# Patient Record
Sex: Female | Born: 1955 | Race: White | Hispanic: No | Marital: Married | Smoking: Never smoker
Health system: Southern US, Community
[De-identification: ages and names within clinical notes are randomized; demographics above are authoritative.]

## PROBLEM LIST (undated history)

## (undated) DIAGNOSIS — C50919 Malignant neoplasm of unspecified site of unspecified female breast: Secondary | ICD-10-CM

## (undated) DIAGNOSIS — Z1379 Encounter for other screening for genetic and chromosomal anomalies: Principal | ICD-10-CM

## (undated) DIAGNOSIS — Z803 Family history of malignant neoplasm of breast: Secondary | ICD-10-CM

## (undated) DIAGNOSIS — Z923 Personal history of irradiation: Secondary | ICD-10-CM

## (undated) DIAGNOSIS — E559 Vitamin D deficiency, unspecified: Secondary | ICD-10-CM

## (undated) HISTORY — DX: Malignant neoplasm of unspecified site of unspecified female breast: C50.919

## (undated) HISTORY — PX: BREAST SURGERY: SHX581

## (undated) HISTORY — DX: Family history of malignant neoplasm of breast: Z80.3

## (undated) HISTORY — PX: BREAST LUMPECTOMY: SHX2

## (undated) HISTORY — DX: Encounter for other screening for genetic and chromosomal anomalies: Z13.79

---

## 2009-05-19 ENCOUNTER — Encounter: Admission: RE | Admit: 2009-05-19 | Discharge: 2009-05-19 | Payer: Self-pay | Admitting: Family Medicine

## 2009-05-22 ENCOUNTER — Encounter: Admission: RE | Admit: 2009-05-22 | Discharge: 2009-05-22 | Payer: Self-pay | Admitting: Family Medicine

## 2009-12-21 ENCOUNTER — Encounter: Admission: RE | Admit: 2009-12-21 | Discharge: 2009-12-21 | Payer: Self-pay | Admitting: Family Medicine

## 2010-05-21 ENCOUNTER — Encounter: Admission: RE | Admit: 2010-05-21 | Discharge: 2010-05-21 | Payer: Self-pay | Admitting: Family Medicine

## 2010-05-28 ENCOUNTER — Encounter: Admission: RE | Admit: 2010-05-28 | Discharge: 2010-05-28 | Payer: Self-pay | Admitting: Family Medicine

## 2010-11-07 ENCOUNTER — Encounter: Payer: Self-pay | Admitting: Family Medicine

## 2010-11-08 ENCOUNTER — Encounter: Payer: Self-pay | Admitting: Family Medicine

## 2011-06-22 ENCOUNTER — Other Ambulatory Visit: Payer: Self-pay | Admitting: Family Medicine

## 2011-06-22 DIAGNOSIS — Z1231 Encounter for screening mammogram for malignant neoplasm of breast: Secondary | ICD-10-CM

## 2011-06-22 DIAGNOSIS — Z780 Asymptomatic menopausal state: Secondary | ICD-10-CM

## 2011-07-07 ENCOUNTER — Ambulatory Visit
Admission: RE | Admit: 2011-07-07 | Discharge: 2011-07-07 | Disposition: A | Discharge status: Home / Self Care | Payer: BC Managed Care – PPO | Source: Ambulatory Visit | Attending: Family Medicine | Admitting: Family Medicine

## 2011-07-07 DIAGNOSIS — Z1231 Encounter for screening mammogram for malignant neoplasm of breast: Secondary | ICD-10-CM

## 2011-07-07 DIAGNOSIS — Z780 Asymptomatic menopausal state: Secondary | ICD-10-CM

## 2012-06-27 ENCOUNTER — Other Ambulatory Visit: Payer: Self-pay | Admitting: Family Medicine

## 2012-06-27 DIAGNOSIS — Z1231 Encounter for screening mammogram for malignant neoplasm of breast: Secondary | ICD-10-CM

## 2012-07-12 ENCOUNTER — Ambulatory Visit
Admission: RE | Admit: 2012-07-12 | Discharge: 2012-07-12 | Disposition: A | Discharge status: Home / Self Care | Payer: BC Managed Care – PPO | Source: Ambulatory Visit | Attending: Family Medicine | Admitting: Family Medicine

## 2012-07-12 DIAGNOSIS — Z1231 Encounter for screening mammogram for malignant neoplasm of breast: Secondary | ICD-10-CM

## 2013-07-22 ENCOUNTER — Other Ambulatory Visit: Payer: Self-pay | Admitting: Family Medicine

## 2013-07-22 DIAGNOSIS — Z1231 Encounter for screening mammogram for malignant neoplasm of breast: Secondary | ICD-10-CM

## 2013-07-24 ENCOUNTER — Ambulatory Visit: Payer: BC Managed Care – PPO

## 2013-08-14 ENCOUNTER — Ambulatory Visit
Admission: RE | Admit: 2013-08-14 | Discharge: 2013-08-14 | Disposition: A | Discharge status: Home / Self Care | Payer: BC Managed Care – PPO | Source: Ambulatory Visit | Attending: Family Medicine | Admitting: Family Medicine

## 2013-08-14 DIAGNOSIS — Z1231 Encounter for screening mammogram for malignant neoplasm of breast: Secondary | ICD-10-CM

## 2014-07-31 ENCOUNTER — Other Ambulatory Visit: Payer: Self-pay

## 2014-07-31 DIAGNOSIS — Z1231 Encounter for screening mammogram for malignant neoplasm of breast: Secondary | ICD-10-CM

## 2014-08-21 ENCOUNTER — Ambulatory Visit
Admission: RE | Admit: 2014-08-21 | Discharge: 2014-08-21 | Disposition: A | Discharge status: Home / Self Care | Payer: BC Managed Care – PPO | Source: Ambulatory Visit

## 2014-08-21 DIAGNOSIS — Z1231 Encounter for screening mammogram for malignant neoplasm of breast: Secondary | ICD-10-CM

## 2015-09-08 ENCOUNTER — Other Ambulatory Visit: Payer: Self-pay

## 2015-09-08 DIAGNOSIS — Z1231 Encounter for screening mammogram for malignant neoplasm of breast: Secondary | ICD-10-CM

## 2015-09-30 ENCOUNTER — Ambulatory Visit
Admission: RE | Admit: 2015-09-30 | Discharge: 2015-09-30 | Disposition: A | Discharge status: Home / Self Care | Payer: PRIVATE HEALTH INSURANCE | Source: Ambulatory Visit

## 2015-09-30 DIAGNOSIS — Z1231 Encounter for screening mammogram for malignant neoplasm of breast: Secondary | ICD-10-CM

## 2016-07-07 DIAGNOSIS — Z136 Encounter for screening for cardiovascular disorders: Secondary | ICD-10-CM | POA: Diagnosis not present

## 2016-07-07 DIAGNOSIS — Z0000 Encounter for general adult medical examination without abnormal findings: Secondary | ICD-10-CM | POA: Diagnosis not present

## 2016-07-12 DIAGNOSIS — Z1211 Encounter for screening for malignant neoplasm of colon: Secondary | ICD-10-CM | POA: Diagnosis not present

## 2016-07-12 DIAGNOSIS — Z0000 Encounter for general adult medical examination without abnormal findings: Secondary | ICD-10-CM | POA: Diagnosis not present

## 2016-07-12 DIAGNOSIS — Z23 Encounter for immunization: Secondary | ICD-10-CM | POA: Diagnosis not present

## 2016-09-06 ENCOUNTER — Other Ambulatory Visit: Payer: Self-pay | Admitting: Family Medicine

## 2016-09-06 DIAGNOSIS — Z1231 Encounter for screening mammogram for malignant neoplasm of breast: Secondary | ICD-10-CM

## 2016-10-07 ENCOUNTER — Ambulatory Visit
Admission: RE | Admit: 2016-10-07 | Discharge: 2016-10-07 | Disposition: A | Discharge status: Home / Self Care | Payer: PRIVATE HEALTH INSURANCE | Source: Ambulatory Visit | Attending: Family Medicine | Admitting: Family Medicine

## 2016-10-07 DIAGNOSIS — Z1231 Encounter for screening mammogram for malignant neoplasm of breast: Secondary | ICD-10-CM

## 2016-11-19 DIAGNOSIS — R05 Cough: Secondary | ICD-10-CM | POA: Diagnosis not present

## 2016-11-19 DIAGNOSIS — J00 Acute nasopharyngitis [common cold]: Secondary | ICD-10-CM | POA: Diagnosis not present

## 2017-09-19 ENCOUNTER — Other Ambulatory Visit: Payer: Self-pay | Admitting: Family Medicine

## 2017-09-19 DIAGNOSIS — Z1231 Encounter for screening mammogram for malignant neoplasm of breast: Secondary | ICD-10-CM

## 2017-10-17 HISTORY — PX: BREAST EXCISIONAL BIOPSY: SUR124

## 2017-10-17 HISTORY — PX: BREAST LUMPECTOMY: SHX2

## 2017-10-23 ENCOUNTER — Ambulatory Visit: Payer: Self-pay

## 2017-11-14 ENCOUNTER — Ambulatory Visit
Admission: RE | Admit: 2017-11-14 | Discharge: 2017-11-14 | Disposition: A | Discharge status: Home / Self Care | Payer: BLUE CROSS/BLUE SHIELD | Source: Ambulatory Visit | Attending: Family Medicine | Admitting: Family Medicine

## 2017-11-14 DIAGNOSIS — Z1231 Encounter for screening mammogram for malignant neoplasm of breast: Secondary | ICD-10-CM

## 2017-11-15 ENCOUNTER — Other Ambulatory Visit: Payer: Self-pay | Admitting: Family Medicine

## 2017-11-15 DIAGNOSIS — R928 Other abnormal and inconclusive findings on diagnostic imaging of breast: Secondary | ICD-10-CM

## 2017-11-17 ENCOUNTER — Ambulatory Visit
Admission: RE | Admit: 2017-11-17 | Discharge: 2017-11-17 | Disposition: A | Discharge status: Home / Self Care | Payer: BLUE CROSS/BLUE SHIELD | Source: Ambulatory Visit | Attending: Family Medicine | Admitting: Family Medicine

## 2017-11-17 ENCOUNTER — Other Ambulatory Visit: Payer: Self-pay | Admitting: Family Medicine

## 2017-11-17 DIAGNOSIS — R928 Other abnormal and inconclusive findings on diagnostic imaging of breast: Secondary | ICD-10-CM

## 2017-11-17 DIAGNOSIS — R922 Inconclusive mammogram: Secondary | ICD-10-CM | POA: Diagnosis not present

## 2017-11-17 DIAGNOSIS — N651 Disproportion of reconstructed breast: Secondary | ICD-10-CM | POA: Diagnosis not present

## 2017-11-21 ENCOUNTER — Ambulatory Visit
Admission: RE | Admit: 2017-11-21 | Discharge: 2017-11-21 | Disposition: A | Discharge status: Home / Self Care | Payer: BLUE CROSS/BLUE SHIELD | Source: Ambulatory Visit | Attending: Family Medicine | Admitting: Family Medicine

## 2017-11-21 ENCOUNTER — Other Ambulatory Visit: Payer: Self-pay | Admitting: Family Medicine

## 2017-11-21 DIAGNOSIS — C50211 Malignant neoplasm of upper-inner quadrant of right female breast: Secondary | ICD-10-CM | POA: Diagnosis not present

## 2017-11-21 DIAGNOSIS — N6489 Other specified disorders of breast: Secondary | ICD-10-CM

## 2017-11-21 DIAGNOSIS — R928 Other abnormal and inconclusive findings on diagnostic imaging of breast: Secondary | ICD-10-CM

## 2017-11-23 ENCOUNTER — Encounter: Payer: Self-pay | Admitting: *Deleted

## 2017-11-23 ENCOUNTER — Telehealth: Payer: Self-pay | Admitting: Oncology

## 2017-11-23 NOTE — Telephone Encounter (Signed)
Spoke with patient to confirm morning [facility] appointment for 11/29/17, will mail packet

## 2017-11-28 ENCOUNTER — Other Ambulatory Visit: Payer: Self-pay | Admitting: *Deleted

## 2017-11-28 DIAGNOSIS — Z170 Estrogen receptor positive status [ER+]: Principal | ICD-10-CM

## 2017-11-28 DIAGNOSIS — C50211 Malignant neoplasm of upper-inner quadrant of right female breast: Secondary | ICD-10-CM | POA: Insufficient documentation

## 2017-11-28 NOTE — Progress Notes (Signed)
[facility]  [phone]) [phone] [phone]) [phone]     ID: Lisa Valenzuela DOB: [birth year]  MR#: [id number]  OTR#:[id number]  Patient Care Team: Fanny Bien, MD as PCP - General (Family Medicine) Magrinat, Virgie Dad, MD as Consulting Physician (Oncology) Alphonsa Overall, MD as Consulting Physician (General Surgery) Kyung Rudd, MD as Consulting Physician (Radiation Oncology) OTHER MD:  CHIEF COMPLAINT:   CURRENT TREATMENT:    HISTORY OF CURRENT ILLNESS: Lisa Valenzuela had routine screening mammography on 11/14/2017 showing a possible abnormality in her bilateral breasts. She underwent a bilateral diagnostic mammography with tomography and bilateral breast ultrasonography at [facility] on 11/17/2017 showing: breast density category C.  The left breast mass was evaluated and proved to be a benign cyst.  On the right however there was an area of architectural distortion, which could not be well delineated.  It was negative on ultrasonography.  The right axilla also was benign on ultrasound.  Accordingly on 11/21/2017 the patient proceeded to biopsy of the right breast area in question. 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 E-cadherin negative. The base of tumor is HER-2 negative and the proliferation marker Ki67 is 2%.   The patient's subsequent history is as detailed below.  INTERVAL HISTORY: Lisa Valenzuela was evaluated in the multidisciplinary breast cancer clinic on 11/29/17 accompanied by her husband, Kasandra Knudsen. Her case was also presented at the multidisciplinary breast cancer conference on the same day. At that time a preliminary plan was proposed: Start with an MRI, then breast conserving surgery, considering Oncotype depending on tumor size, then radiation and antiestrogens   REVIEW OF  SYSTEMS: Lisa Valenzuela is doing well. She denies unusual headaches, visual changes, nausea, vomiting, or dizziness. There has been no unusual cough, phlegm production, or pleurisy. This been no change in bowel or bladder habits. She denies unexplained fatigue or unexplained weight loss, bleeding, rash, or fever. A detailed review of systems was otherwise noncontributory.    PAST MEDICAL HISTORY: Past Medical History:  Diagnosis Date  . Vitamin D deficiency     PAST SURGICAL HISTORY: History reviewed. No pertinent surgical history.  FAMILY HISTORY Family History  Problem Relation Age of Onset  . Breast cancer Mother   . Prostate cancer Father   . Breast cancer Sister   The patient's father, 46, and mother, 4 are still alive. Her father was diagnosed with prostate cancer at 25 years old. Her mother was diagnosed with breast cancer at 62 years old. The patient has two brothers and two sisters. One of her sisters was diagnoses with breast cancer at 63 years old.   GYNECOLOGIC HISTORY:  No LMP recorded. Patient is postmenopausal. Menarche: 62 years old Age at first live birth: 62 years old Silt P 2 LMP 2009 Contraceptive yes, 1980-2000 HRT no  Hysterectomy? 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 Her husband, Kasandra Knudsen is a Theme park manager at [employer].  They have one daughter, [name], 10, who lives in [place], [place], and is a middle school Associate Professor. They also have one son, [name], 30, who lives in [place], [place], and is a Physicist, medical. The patient has two grandchildren.     ADVANCED DIRECTIVES:    HEALTH MAINTENANCE: Social History   Tobacco Use  . Smoking status: Never Smoker  Substance Use Topics  . Alcohol use: No  Frequency: Never  . Drug use: No     Colonoscopy:  PAP:  Bone density:   No Known Allergies  Current Outpatient Medications  Medication Sig Dispense Refill  . Black Cohosh 200 MG CAPS Take by mouth.    .  Cholecalciferol (VITAMIN D3) 1000 units CAPS TAKE 1 CAPSULE BY MOUTH EVERY DAY    . ibuprofen (ADVIL,MOTRIN) 200 MG tablet Take by mouth.    . minocycline (DYNACIN) 75 MG tablet Take by mouth.     No current facility-administered medications for this visit.     OBJECTIVE: Middle-aged white woman who appears well  Vitals:   11/29/17 0850  BP: (!) 157/83  Pulse: 87  Resp: 18  Temp: (!) 97.5 F (36.4 C)     There is no height or weight on file to calculate BMI.   Wt Readings from Last 3 Encounters:  11/29/17 143 lb 14.4 oz (65.3 kg)      ECOG FS:0 - Asymptomatic  Ocular: Sclerae unicteric, pupils round and equal Ear-nose-throat: Oropharynx clear and moist Lymphatic: No cervical or supraclavicular adenopathy Lungs no rales or rhonchi Heart regular rate and rhythm Abd soft, nontender, positive bowel sounds MSK no focal spinal tenderness, no joint edema Neuro: non-focal, well-oriented, appropriate affect Breasts: The right breast is status post recent biopsy.  There is a mild ecchymosis.  I do not palpate a mass.  The left breast is unremarkable.  Both axillae are benign.   LAB RESULTS:  CMP     Component Value Date/Time   NA 144 11/29/2017 0834   K 3.9 11/29/2017 0834   CL 105 11/29/2017 0834   CO2 28 11/29/2017 0834   GLUCOSE 119 11/29/2017 0834   BUN 18 11/29/2017 0834   CREATININE 0.99 11/29/2017 0834   CALCIUM 9.9 11/29/2017 0834   PROT 7.9 11/29/2017 0834   ALBUMIN 4.2 11/29/2017 0834   AST 22 11/29/2017 0834   ALT 20 11/29/2017 0834   ALKPHOS 90 11/29/2017 0834   BILITOT 0.5 11/29/2017 0834   GFRNONAA 60 (L) 11/29/2017 0834   GFRAA >60 11/29/2017 0834    No results found for: TOTALPROTELP, ALBUMINELP, A1GS, A2GS, BETS, BETA2SER, GAMS, MSPIKE, SPEI  No results found for: KPAFRELGTCHN, LAMBDASER, KAPLAMBRATIO  Lab Results  Component Value Date   WBC 6.8 11/29/2017   NEUTROABS 4.3 11/29/2017   HCT 43.0 11/29/2017   MCV 92.0 11/29/2017   PLT 240  11/29/2017    '@LASTCHEMISTRY' @  No results found for: LABCA2  No components found for: [id number]  No results for input(s): INR in the last 168 hours.  No results found for: LABCA2  No results found for: WVP710  No results found for: GYI948  No results found for: NIO270  No results found for: CA2729  No components found for: HGQUANT  No results found for: CEA1 / No results found for: CEA1   No results found for: AFPTUMOR  No results found for: CHROMOGRNA  No results found for: PSA1  Appointment on 11/29/2017  Component Date Value Ref Range Status  . Sodium 11/29/2017 144  136 - 145 mmol/L Final  . Potassium 11/29/2017 3.9  3.5 - 5.1 mmol/L Final  . Chloride 11/29/2017 105  98 - 109 mmol/L Final  . CO2 11/29/2017 28  22 - 29 mmol/L Final  . Glucose, Bld 11/29/2017 119  70 - 140 mg/dL Final  . BUN 11/29/2017 18  7 - 26 mg/dL Final  . Creatinine 11/29/2017 0.99  0.60 - 1.10 mg/dL Final  .  Calcium 11/29/2017 9.9  8.4 - 10.4 mg/dL Final  . Total Protein 11/29/2017 7.9  6.4 - 8.3 g/dL Final  . Albumin 11/29/2017 4.2  3.5 - 5.0 g/dL Final  . AST 11/29/2017 22  5 - 34 U/L Final  . ALT 11/29/2017 20  0 - 55 U/L Final  . Alkaline Phosphatase 11/29/2017 90  40 - 150 U/L Final  . Total Bilirubin 11/29/2017 0.5  0.2 - 1.2 mg/dL Final  . GFR, Est Non Af Am 11/29/2017 60* >60 mL/min Final  . GFR, Est AFR Am 11/29/2017 >60  >60 mL/min Final   Comment: (NOTE) The eGFR has been calculated using the CKD EPI equation. This calculation has not been validated in all clinical situations. eGFR's persistently <60 mL/min signify possible Chronic Kidney Disease.   Georgiann Hahn gap 11/29/2017 11  3 - 11 Final   Performed at [facility], [place] [address] [place], [place] [address]  . WBC Count 11/29/2017 6.8  3.9 - 10.3 K/uL Final  . RBC 11/29/2017 4.67  3.70 - 5.45 MIL/uL Final  . Hemoglobin 11/29/2017 14.5  11.6 - 15.9 g/dL Final  . HCT 11/29/2017 43.0  34.8 - 46.6  % Final  . MCV 11/29/2017 92.0  79.5 - 101.0 fL Final  . MCH 11/29/2017 31.0  25.1 - 34.0 pg Final  . MCHC 11/29/2017 33.7  31.5 - 36.0 g/dL Final  . RDW 11/29/2017 13.1  11.2 - 14.5 % Final  . Platelet Count 11/29/2017 240  145 - 400 K/uL Final  . Neutrophils Relative % 11/29/2017 63  % Final  . Neutro Abs 11/29/2017 4.3  1.5 - 6.5 K/uL Final  . Lymphocytes Relative 11/29/2017 24  % Final  . Lymphs Abs 11/29/2017 1.6  0.9 - 3.3 K/uL Final  . Monocytes Relative 11/29/2017 7  % Final  . Monocytes Absolute 11/29/2017 0.5  0.1 - 0.9 K/uL Final  . Eosinophils Relative 11/29/2017 5  % Final  . Eosinophils Absolute 11/29/2017 0.3  0.0 - 0.5 K/uL Final  . Basophils Relative 11/29/2017 1  % Final  . Basophils Absolute 11/29/2017 0.1  0.0 - 0.1 K/uL Final   Performed at [facility], [place] [address], [place], [place] [address]    (this displays the last labs from the last 3 days)  No results found for: TOTALPROTELP, ALBUMINELP, A1GS, A2GS, BETS, BETA2SER, GAMS, MSPIKE, SPEI (this displays SPEP labs)  No results found for: KPAFRELGTCHN, LAMBDASER, KAPLAMBRATIO (kappa/lambda light chains)  No results found for: HGBA, HGBA2QUANT, HGBFQUANT, HGBSQUAN (Hemoglobinopathy evaluation)   No results found for: LDH  No results found for: IRON, TIBC, IRONPCTSAT (Iron and TIBC)  No results found for: FERRITIN  Urinalysis No results found for: COLORURINE, APPEARANCEUR, LABSPEC, PHURINE, GLUCOSEU, HGBUR, BILIRUBINUR, KETONESUR, PROTEINUR, UROBILINOGEN, NITRITE, LEUKOCYTESUR   STUDIES: US Breast Ltd Uni Left Inc Axilla  Result Date: 11/17/2017 CLINICAL DATA:  Recall from screening mammography with tomosynthesis, possible mass and/or architectural distortion involving the inner right breast at middle posterior depth visible only on the CC view and possible mass involving the lower retroareolar left breast at posterior depth visible only on the MLO view. EXAM: 2D DIGITAL  DIAGNOSTIC BILATERAL MAMMOGRAM WITH CAD AND ADJUNCT TOMO LIMITED ULTRASOUND BILATERAL BREASTS COMPARISON:  Mammography 11/14/2017, 10/07/2016 and earlier. Right breast ultrasound 12/21/2009 and 05/22/2009 were performed in a different part of the breast. No prior left breast ultrasound. ACR Breast Density Category c: The breast tissue is heterogeneously dense, which may obscure  small masses. FINDINGS: Standard and tomosynthesis spot-compression CC view of the right breast in the area of concern and a standard 2D and tomosynthesis full field mediolateral view of the right breast were obtained. The spot compression tomosynthesis images confirm focal architectural distortion involving the inner right breast at middle posterior depth. The asymmetry localizes to the upper breast on the mediolateral tomosynthesis images. No suspicious findings elsewhere on the full field mediolateral tomosynthesis images. Standard and tomosynthesis spot-compression MLO view of the left breast in the area of concern and a standard 2D and tomosynthesis full field mediolateral view of the left breast were obtained. The spot compression tomosynthesis images confirm a circumscribed low-density mass measuring approximately 5 mm in the retroareolar left breast at posterior depth. There is no associated architectural distortion or suspicious calcifications. The mass does not change location when comparing the full field mediolateral view to the screening full field MLO view, indicating that it is centrally located, likely at either 12 o'clock or 6 o'clock. No suspicious findings elsewhere on the full field mediolateral tomosynthesis images. The full field mediolateral images were processed with CAD. On physical exam, there is no palpable abnormality in the upper inner quadrant of the right breast. There is no palpable abnormality in the retroareolar left breast. Targeted right breast ultrasound is performed, showing normal fibrofatty and  fibroglandular tissue throughout the upper inner quadrant. There is no sonographic correlate for the mammographic architectural distortion. Targeted left breast ultrasound is performed, showing a circumscribed oval parallel anechoic mass at the 12 o'clock position approximately 1 cm from the nipple, at far posterior depth, measuring approximately 7 x 4 x 6 mm, demonstrating posterior acoustic enhancement and no internal power Doppler flow, corresponding to the screening mammographic finding. No suspicious solid mass or abnormal acoustic shadowing is identified. Sonographic evaluation of the right axilla demonstrates no pathologic lymphadenopathy. IMPRESSION: 1. Architectural distortion involving the upper inner quadrant of the right breast without sonographic correlate. 2. No pathologic right axillary lymphadenopathy. 3. Benign cyst in the left breast at posterior depth which accounts for the screening mammographic finding. RECOMMENDATION: Stereotactic core needle biopsy of the architectural distortion involving the upper inner right breast. The stereotactic core needle biopsy procedure was discussed with the patient and her questions were answered. She has agreed to proceed and the biopsy has been scheduled for Tuesday, February 5 at 8:30 a.m. I have discussed the findings and recommendations with the patient. Results were also provided in writing at the conclusion of the visit. BI-RADS CATEGORY  4: Suspicious. Electronically Signed   By: Evangeline Dakin M.D.   On: 11/17/2017 15:38   US Breast Ltd Uni Right Inc Axilla  Result Date: 11/17/2017 CLINICAL DATA:  Recall from screening mammography with tomosynthesis, possible mass and/or architectural distortion involving the inner right breast at middle posterior depth visible only on the CC view and possible mass involving the lower retroareolar left breast at posterior depth visible only on the MLO view. EXAM: 2D DIGITAL DIAGNOSTIC BILATERAL MAMMOGRAM WITH CAD AND  ADJUNCT TOMO LIMITED ULTRASOUND BILATERAL BREASTS COMPARISON:  Mammography 11/14/2017, 10/07/2016 and earlier. Right breast ultrasound 12/21/2009 and 05/22/2009 were performed in a different part of the breast. No prior left breast ultrasound. ACR Breast Density Category c: The breast tissue is heterogeneously dense, which may obscure small masses. FINDINGS: Standard and tomosynthesis spot-compression CC view of the right breast in the area of concern and a standard 2D and tomosynthesis full field mediolateral view of the right breast were obtained. The spot  compression tomosynthesis images confirm focal architectural distortion involving the inner right breast at middle posterior depth. The asymmetry localizes to the upper breast on the mediolateral tomosynthesis images. No suspicious findings elsewhere on the full field mediolateral tomosynthesis images. Standard and tomosynthesis spot-compression MLO view of the left breast in the area of concern and a standard 2D and tomosynthesis full field mediolateral view of the left breast were obtained. The spot compression tomosynthesis images confirm a circumscribed low-density mass measuring approximately 5 mm in the retroareolar left breast at posterior depth. There is no associated architectural distortion or suspicious calcifications. The mass does not change location when comparing the full field mediolateral view to the screening full field MLO view, indicating that it is centrally located, likely at either 12 o'clock or 6 o'clock. No suspicious findings elsewhere on the full field mediolateral tomosynthesis images. The full field mediolateral images were processed with CAD. On physical exam, there is no palpable abnormality in the upper inner quadrant of the right breast. There is no palpable abnormality in the retroareolar left breast. Targeted right breast ultrasound is performed, showing normal fibrofatty and fibroglandular tissue throughout the upper inner  quadrant. There is no sonographic correlate for the mammographic architectural distortion. Targeted left breast ultrasound is performed, showing a circumscribed oval parallel anechoic mass at the 12 o'clock position approximately 1 cm from the nipple, at far posterior depth, measuring approximately 7 x 4 x 6 mm, demonstrating posterior acoustic enhancement and no internal power Doppler flow, corresponding to the screening mammographic finding. No suspicious solid mass or abnormal acoustic shadowing is identified. Sonographic evaluation of the right axilla demonstrates no pathologic lymphadenopathy. IMPRESSION: 1. Architectural distortion involving the upper inner quadrant of the right breast without sonographic correlate. 2. No pathologic right axillary lymphadenopathy. 3. Benign cyst in the left breast at posterior depth which accounts for the screening mammographic finding. RECOMMENDATION: Stereotactic core needle biopsy of the architectural distortion involving the upper inner right breast. The stereotactic core needle biopsy procedure was discussed with the patient and her questions were answered. She has agreed to proceed and the biopsy has been scheduled for Tuesday, February 5 at 8:30 a.m. I have discussed the findings and recommendations with the patient. Results were also provided in writing at the conclusion of the visit. BI-RADS CATEGORY  4: Suspicious. Electronically Signed   By: Evangeline Dakin M.D.   On: 11/17/2017 15:38   Mm Diag Breast Tomo Bilateral  Result Date: 11/17/2017 CLINICAL DATA:  Recall from screening mammography with tomosynthesis, possible mass and/or architectural distortion involving the inner right breast at middle posterior depth visible only on the CC view and possible mass involving the lower retroareolar left breast at posterior depth visible only on the MLO view. EXAM: 2D DIGITAL DIAGNOSTIC BILATERAL MAMMOGRAM WITH CAD AND ADJUNCT TOMO LIMITED ULTRASOUND BILATERAL BREASTS  COMPARISON:  Mammography 11/14/2017, 10/07/2016 and earlier. Right breast ultrasound 12/21/2009 and 05/22/2009 were performed in a different part of the breast. No prior left breast ultrasound. ACR Breast Density Category c: The breast tissue is heterogeneously dense, which may obscure small masses. FINDINGS: Standard and tomosynthesis spot-compression CC view of the right breast in the area of concern and a standard 2D and tomosynthesis full field mediolateral view of the right breast were obtained. The spot compression tomosynthesis images confirm focal architectural distortion involving the inner right breast at middle posterior depth. The asymmetry localizes to the upper breast on the mediolateral tomosynthesis images. No suspicious findings elsewhere on the full field mediolateral tomosynthesis  images. Standard and tomosynthesis spot-compression MLO view of the left breast in the area of concern and a standard 2D and tomosynthesis full field mediolateral view of the left breast were obtained. The spot compression tomosynthesis images confirm a circumscribed low-density mass measuring approximately 5 mm in the retroareolar left breast at posterior depth. There is no associated architectural distortion or suspicious calcifications. The mass does not change location when comparing the full field mediolateral view to the screening full field MLO view, indicating that it is centrally located, likely at either 12 o'clock or 6 o'clock. No suspicious findings elsewhere on the full field mediolateral tomosynthesis images. The full field mediolateral images were processed with CAD. On physical exam, there is no palpable abnormality in the upper inner quadrant of the right breast. There is no palpable abnormality in the retroareolar left breast. Targeted right breast ultrasound is performed, showing normal fibrofatty and fibroglandular tissue throughout the upper inner quadrant. There is no sonographic correlate for the  mammographic architectural distortion. Targeted left breast ultrasound is performed, showing a circumscribed oval parallel anechoic mass at the 12 o'clock position approximately 1 cm from the nipple, at far posterior depth, measuring approximately 7 x 4 x 6 mm, demonstrating posterior acoustic enhancement and no internal power Doppler flow, corresponding to the screening mammographic finding. No suspicious solid mass or abnormal acoustic shadowing is identified. Sonographic evaluation of the right axilla demonstrates no pathologic lymphadenopathy. IMPRESSION: 1. Architectural distortion involving the upper inner quadrant of the right breast without sonographic correlate. 2. No pathologic right axillary lymphadenopathy. 3. Benign cyst in the left breast at posterior depth which accounts for the screening mammographic finding. RECOMMENDATION: Stereotactic core needle biopsy of the architectural distortion involving the upper inner right breast. The stereotactic core needle biopsy procedure was discussed with the patient and her questions were answered. She has agreed to proceed and the biopsy has been scheduled for Tuesday, February 5 at 8:30 a.m. I have discussed the findings and recommendations with the patient. Results were also provided in writing at the conclusion of the visit. BI-RADS CATEGORY  4: Suspicious. Electronically Signed   By: Evangeline Dakin M.D.   On: 11/17/2017 15:38   Mm Screening Breast Tomo Bilateral  Result Date: 11/14/2017 CLINICAL DATA:  Screening. EXAM: 2D DIGITAL SCREENING BILATERAL MAMMOGRAM WITH 3D TOMO WITH CAD COMPARISON:  Previous exam(s). ACR Breast Density Category c: The breast tissue is heterogeneously dense, which may obscure small masses. FINDINGS: In the right breast distortion requires further evaluation. In the left breast mass requires further evaluation. Images were processed with CAD. IMPRESSION: Further evaluation is suggested for possible distortion in the right  breast. Further evaluation is suggested for possible mass in the left breast. RECOMMENDATION: 3D diagnostic mammogram and possibly ultrasound of both breasts. (Code:FI-B-64M) The patient will be contacted regarding the findings, and additional imaging will be scheduled. BI-RADS CATEGORY  0: Incomplete. Need additional imaging evaluation and/or prior mammograms for comparison. Electronically Signed   By: Lovey Newcomer M.D.   On: 11/14/2017 10:54   Mm Clip Placement Right  Result Date: 11/21/2017 CLINICAL DATA:  62 year old female status post stereotactic biopsy of the right breast. EXAM: DIAGNOSTIC RIGHT MAMMOGRAM POST STEREOTACTIC BIOPSY COMPARISON:  Previous exam(s). FINDINGS: Mammographic images were obtained following stereotactic guided biopsy of right breast distortion. Coil shaped clip is identified in the upper inner quadrant at posterior depth. The clip appears inferiorly migrated from the regional area of distortion by approximately 1 cm. IMPRESSION: Coil shaped clip in the upper  inner right breast at posterior depth status post stereotactic biopsy of distortion. The clip is approximately 1 cm inferiorly migrated relative to the area of distortion. Final Assessment: Post Procedure Mammograms for Marker Placement Electronically Signed   By: Kristopher Oppenheim M.D.   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 E-cadherin is negative supporting lobular origin. This was found to be concordant by Dr. Kristopher Oppenheim. Pathology results were discussed with the patient by telephone. The patient reported doing well after the biopsy with tenderness at the site. Post biopsy instructions and care were reviewed and questions were answered. The patient was encouraged to call [facility] for any additional concerns. The patient was referred to [facility] on November 29, 2017. Recommendation for bilateral breast MRI given the lobular histology. Pathology results reported by Terie Purser, RN on 11/23/2017. Electronically Signed   By: Kristopher Oppenheim M.D.   On: 11/23/2017 06:57   Result Date: 11/23/2017 CLINICAL DATA:  62 year old female with right breast distortion. EXAM: RIGHT BREAST STEREOTACTIC CORE NEEDLE BIOPSY COMPARISON:  Previous exams. FINDINGS: The patient and I discussed the procedure of stereotactic-guided biopsy including benefits and alternatives. We discussed the high likelihood of a successful procedure. We discussed the risks of the procedure including infection, bleeding, tissue injury, clip migration, and inadequate sampling. Informed written consent was given. The usual time out protocol was performed immediately prior to the procedure. Using sterile technique and 1% Lidocaine as local anesthetic, under stereotactic guidance, a 9 gauge vacuum assisted device was used to perform core needle biopsy of distortion in the upper inner quadrant of the right breast using a superior approach. Lesion quadrant: Upper inner quadrant At the conclusion of the procedure, a coil shaped tissue marker clip was deployed into the biopsy cavity. Follow-up 2-view mammogram was performed and dictated separately. IMPRESSION: Stereotactic-guided biopsy of right breast distortion. No apparent complications. Electronically Signed: By: Kristopher Oppenheim M.D. On: 11/21/2017 09:25    ELIGIBLE FOR AVAILABLE RESEARCH PROTOCOL:no  ASSESSMENT: 63 y.o. [place], [place] woman status post right breast upper inner quadrant biopsy 11/21/2017 for a clinical TXN0 invasive lobular carcinoma, grade 2, estrogen and progesterone receptor positive, HER-2 not amplified, with an MIB-1 of 2%.  (1) breast conserving surgery  pending  (2) consider Oncotype depending on tumor size: Adjuvant chemotherapy not anticipated  (3) adjuvant radiation to follow  (4) antiestrogens to follow at the completion of local treatment  (5) genetics testing pending  PLAN: We spent the better part of today's hour-long appointment discussing the biology of her diagnosis and the specifics of her situation.  We first reviewed the fact that cancer is not one disease but more than 100 different diseases and that it is important to keep them separate-- otherwise when friends and relatives discuss their own cancer experiences with [place] confusion can result. Similarly we explained that if breast cancer spreads to the bone or liver, the patient would not have bone cancer or liver cancer, but breast cancer in the bone and breast cancer in the liver: one cancer in three places-- not 3 different cancers which otherwise would have to be treated in 3 different ways.  We discussed the difference between local and  systemic therapy. In terms of loco-regional treatment, lumpectomy plus radiation is equivalent to mastectomy as far as survival is concerned. For this reason, and because the cosmetic results are generally superior, we recommend breast conserving surgery.   We then discussed the rationale for systemic therapy. There is some risk that this cancer may have already spread to other parts of her body. Patients frequently ask at this point about bone scans, CAT scans and PET scans to find out if they have occult breast cancer somewhere else. The problem is that in early stage disease we are much more likely to find false positives then true cancers and this would expose the patient to unnecessary procedures as well as unnecessary radiation. Scans cannot answer the question the patient really would like to know, which is whether she has microscopic disease elsewhere in her body. For those reasons we do not recommend them.  Of course we would proceed to  aggressive evaluation of any symptoms that might suggest metastatic disease, but that is not the case here.  Next we went over the options for systemic therapy which are anti-estrogens, anti-HER-2 immunotherapy, and chemotherapy. Curstin does not meet criteria for anti-HER-2 immunotherapy. She is a good candidate for anti-estrogens.  The question of chemotherapy is more complicated. Chemotherapy is most effective in rapidly growing, aggressive tumors. It is much less effective in low-grade, slow growing cancers, like [place] 's. For that reason we are going to request an Oncotype from the definitive surgical sample, assuming the cancer proves to be greater than 0.5 cm.  However the patient understands I do not anticipate that she will either need chemotherapy or benefit from  [place] does qualify for genetics testing. In patients who carry a deleterious mutation [for example in a  BRCA gene], the risk of a new breast cancer developing in the future may be sufficiently great that the patient may choose bilateral mastectomies. However if she wishes to keep her breasts in that situation it is safe to do so. That would require intensified screening, which generally means not only yearly mammography but a yearly breast MRI as well. Of course, if there is a deleterious mutation bilateral oophorectomy would be necessary as there is no standard screening protocol for ovarian cancer.  The patient is clear that even if she did carry a deleterious mutation she would choose intensified screening, not bilateral mastectomies.  Accordingly there is no reason to delay her planned surgery.  The overall plan, then, is for surgery, consideration of Oncotype, but likely no chemotherapy, then adjuvant radiation, then antiestrogens.  Judene has a good understanding of the overall plan. She agrees with it. She knows the goal of treatment in her case is cure. She will call with any problems that may develop before her next visit  here.  Magrinat, Virgie Dad, MD  11/29/17 10:53 AM Medical Oncology and Hematology [facility] [address] [place], [place] [address] Tel. [phone]    Fax. [phone]  This document serves as a record of services personally performed by Chauncey Cruel, MD. It was created on his behalf by Margit Banda, a trained medical scribe. The creation of this record is based on the scribe's personal observations and the provider's statements to them.   I have reviewed the above documentation for accuracy and completeness, and I agree with the above.

## 2017-11-29 ENCOUNTER — Other Ambulatory Visit: Payer: Self-pay

## 2017-11-29 ENCOUNTER — Inpatient Hospital Stay: Payer: BLUE CROSS/BLUE SHIELD

## 2017-11-29 ENCOUNTER — Encounter: Payer: Self-pay | Admitting: Physical Therapy

## 2017-11-29 ENCOUNTER — Encounter: Payer: Self-pay | Admitting: Oncology

## 2017-11-29 ENCOUNTER — Ambulatory Visit: Payer: BLUE CROSS/BLUE SHIELD | Attending: Surgery | Admitting: Physical Therapy

## 2017-11-29 ENCOUNTER — Ambulatory Visit
Admission: RE | Admit: 2017-11-29 | Discharge: 2017-11-29 | Disposition: A | Discharge status: Home / Self Care | Payer: BLUE CROSS/BLUE SHIELD | Source: Ambulatory Visit | Attending: Radiation Oncology | Admitting: Radiation Oncology

## 2017-11-29 ENCOUNTER — Other Ambulatory Visit: Payer: Self-pay | Admitting: *Deleted

## 2017-11-29 ENCOUNTER — Encounter: Payer: Self-pay | Admitting: Radiation Oncology

## 2017-11-29 ENCOUNTER — Inpatient Hospital Stay: Payer: BLUE CROSS/BLUE SHIELD | Attending: Oncology | Admitting: Oncology

## 2017-11-29 ENCOUNTER — Encounter: Payer: Self-pay | Admitting: *Deleted

## 2017-11-29 VITALS — BP 157/83 | HR 87 | Temp 97.5°F | Resp 18 | Wt 143.9 lb

## 2017-11-29 DIAGNOSIS — C50211 Malignant neoplasm of upper-inner quadrant of right female breast: Secondary | ICD-10-CM

## 2017-11-29 DIAGNOSIS — Z170 Estrogen receptor positive status [ER+]: Principal | ICD-10-CM

## 2017-11-29 DIAGNOSIS — R293 Abnormal posture: Secondary | ICD-10-CM | POA: Insufficient documentation

## 2017-11-29 DIAGNOSIS — N920 Excessive and frequent menstruation with regular cycle: Secondary | ICD-10-CM | POA: Diagnosis not present

## 2017-11-29 DIAGNOSIS — C50911 Malignant neoplasm of unspecified site of right female breast: Secondary | ICD-10-CM | POA: Diagnosis not present

## 2017-11-29 DIAGNOSIS — D500 Iron deficiency anemia secondary to blood loss (chronic): Secondary | ICD-10-CM | POA: Diagnosis not present

## 2017-11-29 DIAGNOSIS — Z803 Family history of malignant neoplasm of breast: Secondary | ICD-10-CM | POA: Diagnosis not present

## 2017-11-29 DIAGNOSIS — Z809 Family history of malignant neoplasm, unspecified: Secondary | ICD-10-CM

## 2017-11-29 HISTORY — DX: Vitamin D deficiency, unspecified: E55.9

## 2017-11-29 LAB — CBC WITH DIFFERENTIAL (CANCER CENTER ONLY)
BASOS ABS: 0.1 10*3/uL (ref 0.0–0.1)
BASOS PCT: 1 %
Eosinophils Absolute: 0.3 10*3/uL (ref 0.0–0.5)
Eosinophils Relative: 5 %
HEMATOCRIT: 43 % (ref 34.8–46.6)
Hemoglobin: 14.5 g/dL (ref 11.6–15.9)
Lymphocytes Relative: 24 %
Lymphs Abs: 1.6 10*3/uL (ref 0.9–3.3)
MCH: 31 pg (ref 25.1–34.0)
MCHC: 33.7 g/dL (ref 31.5–36.0)
MCV: 92 fL (ref 79.5–101.0)
MONO ABS: 0.5 10*3/uL (ref 0.1–0.9)
Monocytes Relative: 7 %
NEUTROS ABS: 4.3 10*3/uL (ref 1.5–6.5)
NEUTROS PCT: 63 %
Platelet Count: 240 10*3/uL (ref 145–400)
RBC: 4.67 MIL/uL (ref 3.70–5.45)
RDW: 13.1 % (ref 11.2–14.5)
WBC: 6.8 10*3/uL (ref 3.9–10.3)

## 2017-11-29 LAB — CMP (CANCER CENTER ONLY)
ALT: 20 U/L (ref 0–55)
ANION GAP: 11 (ref 3–11)
AST: 22 U/L (ref 5–34)
Albumin: 4.2 g/dL (ref 3.5–5.0)
Alkaline Phosphatase: 90 U/L (ref 40–150)
BILIRUBIN TOTAL: 0.5 mg/dL (ref 0.2–1.2)
BUN: 18 mg/dL (ref 7–26)
CALCIUM: 9.9 mg/dL (ref 8.4–10.4)
CO2: 28 mmol/L (ref 22–29)
Chloride: 105 mmol/L (ref 98–109)
Creatinine: 0.99 mg/dL (ref 0.60–1.10)
GFR, EST NON AFRICAN AMERICAN: 60 mL/min — AB (ref >60)
GLUCOSE: 119 mg/dL (ref 70–140)
Potassium: 3.9 mmol/L (ref 3.5–5.1)
Sodium: 144 mmol/L (ref 136–145)
TOTAL PROTEIN: 7.9 g/dL (ref 6.4–8.3)

## 2017-11-29 NOTE — Progress Notes (Signed)
Radiation Oncology         ([phone]) [phone] ________________________________  Name: Lisa Valenzuela        MRN: [id number]  Date of Service: 11/29/2017 DOB: Feb 16, 1956  PI:RJJOA, Mechele Claude, MD  Alphonsa Overall, MD     REFERRING PHYSICIAN: Alphonsa Overall, MD   DIAGNOSIS: The encounter diagnosis was Malignant neoplasm of upper-inner quadrant of right breast in female, estrogen receptor positive ([place]).   HISTORY OF PRESENT ILLNESS: Lisa Valenzuela is a 62 y.o. female seen in the multidisciplinary breast clinic for a new diagnosis of right breast cancer. The patient was noted to have screening distortion on her mammography. She was found to have this abnormality in the upper inner quadrant, but diagnostic ultrasound did not detect correlate, or any axillary adenopathy. A tomoguided biopsy on 11/21/17 revealed a grade 2 invasive lobular carcinoma, ER/PR positive, HER2 negative, Ki 67 of 2%. She comes today to discuss options of treatment for her cancer.   PREVIOUS RADIATION THERAPY: No   PAST MEDICAL HISTORY: History reviewed. No pertinent past medical history.     PAST SURGICAL HISTORY:No past surgical history on file.   FAMILY HISTORY:  Family History  Problem Relation Age of Onset  . Breast cancer Mother   . Prostate cancer Father   . Breast cancer Sister      SOCIAL HISTORY:  reports that  has never smoked. She does not have any smokeless tobacco history on file. She reports that she does not drink alcohol or use drugs. The patient is married and lives in [place]. She works as an Optometrist. Her husband is a Designer, fashion/clothing.    ALLERGIES: Patient has no known allergies.   MEDICATIONS:  Current Outpatient Medications  Medication Sig Dispense Refill  . Black Cohosh 200 MG CAPS Take by mouth.    . Cholecalciferol (VITAMIN D3) 1000 units CAPS TAKE 1 CAPSULE BY MOUTH EVERY DAY    . ibuprofen (ADVIL,MOTRIN) 200 MG tablet Take by mouth.    . minocycline (DYNACIN) 75 MG tablet Take by  mouth.     No current facility-administered medications for this encounter.      REVIEW OF SYSTEMS: On review of systems, the patient reports that she is doing well overall. She denies any chest pain, shortness of breath, cough, fevers, chills, night sweats, unintended weight changes. She denies any bowel or bladder disturbances, and denies abdominal pain, nausea or vomiting. She denies any new musculoskeletal or joint aches or pains. A complete review of systems is obtained and is otherwise negative.     PHYSICAL EXAM:  Wt Readings from Last 3 Encounters:  11/29/17 143 lb 14.4 oz (65.3 kg)   Temp Readings from Last 3 Encounters:  11/29/17 (!) 97.5 F (36.4 C) (Oral)   BP Readings from Last 3 Encounters:  11/29/17 (!) 157/83   Pulse Readings from Last 3 Encounters:  11/29/17 87     In general this is a well appearing caucasian  female in no acute distress. She is alert and oriented x4 and appropriate throughout the examination. HEENT reveals that the patient is normocephalic, atraumatic. EOMs are intact. PERRLA. Skin is intact without any evidence of gross lesions. Cardiovascular exam reveals a regular rate and rhythm, no clicks rubs or murmurs are auscultated. Chest is clear to auscultation bilaterally. Lymphatic assessment is performed and does not reveal any adenopathy in the cervical, supraclavicular, axillary, or inguinal chains. Bilateral breast exam is performed and reveals fullness deep to her biopsy site on the right breast.  No palpable mass is noted of the left breast, and no nipple bleeding or discharge is noted. Abdomen has active bowel sounds in all quadrants and is intact. The abdomen is soft, non tender, non distended. Lower extremities are negative for pretibial pitting edema, deep calf tenderness, cyanosis or clubbing.   ECOG = 0  0 - Asymptomatic (Fully active, able to carry on all predisease activities without restriction)  1 - Symptomatic but completely ambulatory  (Restricted in physically strenuous activity but ambulatory and able to carry out work of a light or sedentary nature. For example, light housework, office work)  2 - Symptomatic, <50% in bed during the day (Ambulatory and capable of all self care but unable to carry out any work activities. Up and about more than 50% of waking hours)  3 - Symptomatic, >50% in bed, but not bedbound (Capable of only limited self-care, confined to bed or chair 50% or more of waking hours)  4 - Bedbound (Completely disabled. Cannot carry on any self-care. Totally confined to bed or chair)  5 - Death   Eustace Pen MM, Creech RH, Tormey DC, et al. [phone]). "Toxicity and response criteria of the [facility] Group". [place] Oncol. 5 (6): 649-55    LABORATORY DATA:  Lab Results  Component Value Date   WBC 6.8 11/29/2017   HCT 43.0 11/29/2017   MCV 92.0 11/29/2017   PLT 240 11/29/2017   Lab Results  Component Value Date   NA 144 11/29/2017   K 3.9 11/29/2017   CL 105 11/29/2017   CO2 28 11/29/2017   Lab Results  Component Value Date   ALT 20 11/29/2017   AST 22 11/29/2017   ALKPHOS 90 11/29/2017   BILITOT 0.5 11/29/2017      RADIOGRAPHY: US Breast Ltd Uni Left Inc Axilla  Result Date: 11/17/2017 CLINICAL DATA:  Recall from screening mammography with tomosynthesis, possible mass and/or architectural distortion involving the inner right breast at middle posterior depth visible only on the CC view and possible mass involving the lower retroareolar left breast at posterior depth visible only on the MLO view. EXAM: 2D DIGITAL DIAGNOSTIC BILATERAL MAMMOGRAM WITH CAD AND ADJUNCT TOMO LIMITED ULTRASOUND BILATERAL BREASTS COMPARISON:  Mammography 11/14/2017, 10/07/2016 and earlier. Right breast ultrasound 12/21/2009 and 05/22/2009 were performed in a different part of the breast. No prior left breast ultrasound. ACR Breast Density Category c: The breast tissue is heterogeneously dense, which may  obscure small masses. FINDINGS: Standard and tomosynthesis spot-compression CC view of the right breast in the area of concern and a standard 2D and tomosynthesis full field mediolateral view of the right breast were obtained. The spot compression tomosynthesis images confirm focal architectural distortion involving the inner right breast at middle posterior depth. The asymmetry localizes to the upper breast on the mediolateral tomosynthesis images. No suspicious findings elsewhere on the full field mediolateral tomosynthesis images. Standard and tomosynthesis spot-compression MLO view of the left breast in the area of concern and a standard 2D and tomosynthesis full field mediolateral view of the left breast were obtained. The spot compression tomosynthesis images confirm a circumscribed low-density mass measuring approximately 5 mm in the retroareolar left breast at posterior depth. There is no associated architectural distortion or suspicious calcifications. The mass does not change location when comparing the full field mediolateral view to the screening full field MLO view, indicating that it is centrally located, likely at either 12 o'clock or 6 o'clock. No suspicious findings elsewhere on the full field  mediolateral tomosynthesis images. The full field mediolateral images were processed with CAD. On physical exam, there is no palpable abnormality in the upper inner quadrant of the right breast. There is no palpable abnormality in the retroareolar left breast. Targeted right breast ultrasound is performed, showing normal fibrofatty and fibroglandular tissue throughout the upper inner quadrant. There is no sonographic correlate for the mammographic architectural distortion. Targeted left breast ultrasound is performed, showing a circumscribed oval parallel anechoic mass at the 12 o'clock position approximately 1 cm from the nipple, at far posterior depth, measuring approximately 7 x 4 x 6 mm, demonstrating  posterior acoustic enhancement and no internal power Doppler flow, corresponding to the screening mammographic finding. No suspicious solid mass or abnormal acoustic shadowing is identified. Sonographic evaluation of the right axilla demonstrates no pathologic lymphadenopathy. IMPRESSION: 1. Architectural distortion involving the upper inner quadrant of the right breast without sonographic correlate. 2. No pathologic right axillary lymphadenopathy. 3. Benign cyst in the left breast at posterior depth which accounts for the screening mammographic finding. RECOMMENDATION: Stereotactic core needle biopsy of the architectural distortion involving the upper inner right breast. The stereotactic core needle biopsy procedure was discussed with the patient and her questions were answered. She has agreed to proceed and the biopsy has been scheduled for Tuesday, February 5 at 8:30 a.m. I have discussed the findings and recommendations with the patient. Results were also provided in writing at the conclusion of the visit. BI-RADS CATEGORY  4: Suspicious. Electronically Signed   By: Evangeline Dakin M.D.   On: 11/17/2017 15:38   US Breast Ltd Uni Right Inc Axilla  Result Date: 11/17/2017 CLINICAL DATA:  Recall from screening mammography with tomosynthesis, possible mass and/or architectural distortion involving the inner right breast at middle posterior depth visible only on the CC view and possible mass involving the lower retroareolar left breast at posterior depth visible only on the MLO view. EXAM: 2D DIGITAL DIAGNOSTIC BILATERAL MAMMOGRAM WITH CAD AND ADJUNCT TOMO LIMITED ULTRASOUND BILATERAL BREASTS COMPARISON:  Mammography 11/14/2017, 10/07/2016 and earlier. Right breast ultrasound 12/21/2009 and 05/22/2009 were performed in a different part of the breast. No prior left breast ultrasound. ACR Breast Density Category c: The breast tissue is heterogeneously dense, which may obscure small masses. FINDINGS: Standard and  tomosynthesis spot-compression CC view of the right breast in the area of concern and a standard 2D and tomosynthesis full field mediolateral view of the right breast were obtained. The spot compression tomosynthesis images confirm focal architectural distortion involving the inner right breast at middle posterior depth. The asymmetry localizes to the upper breast on the mediolateral tomosynthesis images. No suspicious findings elsewhere on the full field mediolateral tomosynthesis images. Standard and tomosynthesis spot-compression MLO view of the left breast in the area of concern and a standard 2D and tomosynthesis full field mediolateral view of the left breast were obtained. The spot compression tomosynthesis images confirm a circumscribed low-density mass measuring approximately 5 mm in the retroareolar left breast at posterior depth. There is no associated architectural distortion or suspicious calcifications. The mass does not change location when comparing the full field mediolateral view to the screening full field MLO view, indicating that it is centrally located, likely at either 12 o'clock or 6 o'clock. No suspicious findings elsewhere on the full field mediolateral tomosynthesis images. The full field mediolateral images were processed with CAD. On physical exam, there is no palpable abnormality in the upper inner quadrant of the right breast. There is no palpable abnormality in the  retroareolar left breast. Targeted right breast ultrasound is performed, showing normal fibrofatty and fibroglandular tissue throughout the upper inner quadrant. There is no sonographic correlate for the mammographic architectural distortion. Targeted left breast ultrasound is performed, showing a circumscribed oval parallel anechoic mass at the 12 o'clock position approximately 1 cm from the nipple, at far posterior depth, measuring approximately 7 x 4 x 6 mm, demonstrating posterior acoustic enhancement and no internal  power Doppler flow, corresponding to the screening mammographic finding. No suspicious solid mass or abnormal acoustic shadowing is identified. Sonographic evaluation of the right axilla demonstrates no pathologic lymphadenopathy. IMPRESSION: 1. Architectural distortion involving the upper inner quadrant of the right breast without sonographic correlate. 2. No pathologic right axillary lymphadenopathy. 3. Benign cyst in the left breast at posterior depth which accounts for the screening mammographic finding. RECOMMENDATION: Stereotactic core needle biopsy of the architectural distortion involving the upper inner right breast. The stereotactic core needle biopsy procedure was discussed with the patient and her questions were answered. She has agreed to proceed and the biopsy has been scheduled for Tuesday, February 5 at 8:30 a.m. I have discussed the findings and recommendations with the patient. Results were also provided in writing at the conclusion of the visit. BI-RADS CATEGORY  4: Suspicious. Electronically Signed   By: Evangeline Dakin M.D.   On: 11/17/2017 15:38   Mm Diag Breast Tomo Bilateral  Result Date: 11/17/2017 CLINICAL DATA:  Recall from screening mammography with tomosynthesis, possible mass and/or architectural distortion involving the inner right breast at middle posterior depth visible only on the CC view and possible mass involving the lower retroareolar left breast at posterior depth visible only on the MLO view. EXAM: 2D DIGITAL DIAGNOSTIC BILATERAL MAMMOGRAM WITH CAD AND ADJUNCT TOMO LIMITED ULTRASOUND BILATERAL BREASTS COMPARISON:  Mammography 11/14/2017, 10/07/2016 and earlier. Right breast ultrasound 12/21/2009 and 05/22/2009 were performed in a different part of the breast. No prior left breast ultrasound. ACR Breast Density Category c: The breast tissue is heterogeneously dense, which may obscure small masses. FINDINGS: Standard and tomosynthesis spot-compression CC view of the right  breast in the area of concern and a standard 2D and tomosynthesis full field mediolateral view of the right breast were obtained. The spot compression tomosynthesis images confirm focal architectural distortion involving the inner right breast at middle posterior depth. The asymmetry localizes to the upper breast on the mediolateral tomosynthesis images. No suspicious findings elsewhere on the full field mediolateral tomosynthesis images. Standard and tomosynthesis spot-compression MLO view of the left breast in the area of concern and a standard 2D and tomosynthesis full field mediolateral view of the left breast were obtained. The spot compression tomosynthesis images confirm a circumscribed low-density mass measuring approximately 5 mm in the retroareolar left breast at posterior depth. There is no associated architectural distortion or suspicious calcifications. The mass does not change location when comparing the full field mediolateral view to the screening full field MLO view, indicating that it is centrally located, likely at either 12 o'clock or 6 o'clock. No suspicious findings elsewhere on the full field mediolateral tomosynthesis images. The full field mediolateral images were processed with CAD. On physical exam, there is no palpable abnormality in the upper inner quadrant of the right breast. There is no palpable abnormality in the retroareolar left breast. Targeted right breast ultrasound is performed, showing normal fibrofatty and fibroglandular tissue throughout the upper inner quadrant. There is no sonographic correlate for the mammographic architectural distortion. Targeted left breast ultrasound is performed, showing a  circumscribed oval parallel anechoic mass at the 12 o'clock position approximately 1 cm from the nipple, at far posterior depth, measuring approximately 7 x 4 x 6 mm, demonstrating posterior acoustic enhancement and no internal power Doppler flow, corresponding to the screening  mammographic finding. No suspicious solid mass or abnormal acoustic shadowing is identified. Sonographic evaluation of the right axilla demonstrates no pathologic lymphadenopathy. IMPRESSION: 1. Architectural distortion involving the upper inner quadrant of the right breast without sonographic correlate. 2. No pathologic right axillary lymphadenopathy. 3. Benign cyst in the left breast at posterior depth which accounts for the screening mammographic finding. RECOMMENDATION: Stereotactic core needle biopsy of the architectural distortion involving the upper inner right breast. The stereotactic core needle biopsy procedure was discussed with the patient and her questions were answered. She has agreed to proceed and the biopsy has been scheduled for Tuesday, February 5 at 8:30 a.m. I have discussed the findings and recommendations with the patient. Results were also provided in writing at the conclusion of the visit. BI-RADS CATEGORY  4: Suspicious. Electronically Signed   By: Evangeline Dakin M.D.   On: 11/17/2017 15:38   Mm Screening Breast Tomo Bilateral  Result Date: 11/14/2017 CLINICAL DATA:  Screening. EXAM: 2D DIGITAL SCREENING BILATERAL MAMMOGRAM WITH 3D TOMO WITH CAD COMPARISON:  Previous exam(s). ACR Breast Density Category c: The breast tissue is heterogeneously dense, which may obscure small masses. FINDINGS: In the right breast distortion requires further evaluation. In the left breast mass requires further evaluation. Images were processed with CAD. IMPRESSION: Further evaluation is suggested for possible distortion in the right breast. Further evaluation is suggested for possible mass in the left breast. RECOMMENDATION: 3D diagnostic mammogram and possibly ultrasound of both breasts. (Code:[id number]) The patient will be contacted regarding the findings, and additional imaging will be scheduled. BI-RADS CATEGORY  0: Incomplete. Need additional imaging evaluation and/or prior mammograms for comparison.  Electronically Signed   By: Lovey Newcomer M.D.   On: 11/14/2017 10:54   Mm Clip Placement Right  Result Date: 11/21/2017 CLINICAL DATA:  62 year old female status post stereotactic biopsy of the right breast. EXAM: DIAGNOSTIC RIGHT MAMMOGRAM POST STEREOTACTIC BIOPSY COMPARISON:  Previous exam(s). FINDINGS: Mammographic images were obtained following stereotactic guided biopsy of right breast distortion. Coil shaped clip is identified in the upper inner quadrant at posterior depth. The clip appears inferiorly migrated from the regional area of distortion by approximately 1 cm. IMPRESSION: Coil shaped clip in the upper inner right breast at posterior depth status post stereotactic biopsy of distortion. The clip is approximately 1 cm inferiorly migrated relative to the area of distortion. Final Assessment: Post Procedure Mammograms for Marker Placement Electronically Signed   By: Kristopher Oppenheim M.D.   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 E-cadherin is negative supporting lobular origin. This was found to be concordant by Dr. Kristopher Oppenheim. Pathology results were discussed with the patient by telephone. The patient reported doing well after the biopsy with tenderness at the site. Post biopsy instructions and care were reviewed and questions were answered. The patient was encouraged to call [facility] for any additional concerns. The patient was referred to [facility] on November 29, 2017. Recommendation  for bilateral breast MRI given the lobular histology. Pathology results reported by Terie Purser, RN on 11/23/2017. Electronically Signed   By: Kristopher Oppenheim M.D.   On:  11/23/2017 06:57   Result Date: 11/23/2017 CLINICAL DATA:  62 year old female with right breast distortion. EXAM: RIGHT BREAST STEREOTACTIC CORE NEEDLE BIOPSY COMPARISON:  Previous exams. FINDINGS: The patient and I discussed the procedure of stereotactic-guided biopsy including benefits and alternatives. We discussed the high likelihood of a successful procedure. We discussed the risks of the procedure including infection, bleeding, tissue injury, clip migration, and inadequate sampling. Informed written consent was given. The usual time out protocol was performed immediately prior to the procedure. Using sterile technique and 1% Lidocaine as local anesthetic, under stereotactic guidance, a 9 gauge vacuum assisted device was used to perform core needle biopsy of distortion in the upper inner quadrant of the right breast using a superior approach. Lesion quadrant: Upper inner quadrant At the conclusion of the procedure, a coil shaped tissue marker clip was deployed into the biopsy cavity. Follow-up 2-view mammogram was performed and dictated separately. IMPRESSION: Stereotactic-guided biopsy of right breast distortion. No apparent complications. Electronically Signed: By: Kristopher Oppenheim M.D. On: 11/21/2017 09:25       IMPRESSION/PLAN: 1. Probable Stage IA, [id number], grade 2, ER/PR positive invasive lobular carcinoma of the right breast. Dr. Lisbeth Renshaw discusses the pathology findings and reviews the nature of invasive breast disease. The consensus from the breast conference included proceeding with MRI of the breasts to evaluate extent of disease. She is a candidate for breast conservation with lumpectomy with  sentinel mapping and will proceed with this with Dr. Lucia Gaskins. Depending on the size of the final tumor measurements rendered by pathology, the tumor may be tested for oncotype dx score to determine a role for systemic therapy. Provided that chemotherapy is not indicated, the patient's course would then be  followed by external radiotherapy to the breast followed by antiestrogen therapy. We discussed the risks, benefits, short, and long term effects of radiotherapy, and the patient is interested in proceeding. Dr. Lisbeth Renshaw discusses the delivery and logistics of radiotherapy and anticipates a course of 4 weeks. We will see her back about 2 weeks after surgery to move forward with the simulation and planning process and anticipate starting radiotherapy about 4 weeks after surgery.  2. Possible genetic predisposition to malignancy. The patient's personal and family history has been reviewed and she is a candidate for genetic testing. She will proceed with this, but reports this information would not change her approach to surgical decision making.  The above documentation reflects my direct findings during this shared patient visit. Please see the separate note by Dr. Lisbeth Renshaw on this date for the remainder of the patient's plan of care.    Carola Rhine, PAC

## 2017-11-29 NOTE — Progress Notes (Signed)
Clinical Social Work [facility] Psychosocial Distress Screening [place]  Patient completed distress screening protocol and scored a 2 on the Psychosocial Distress Thermometer which indicates mild distress. Clinical Social Worker met with patient and patients husband in [facility] to assess for distress and other psychosocial needs. Patient stated she was feeling overwhelmed but felt "better" after meeting with the treatment team and getting more information on her treatment plan. CSW and patient discussed common feeling and emotions when being diagnosed with cancer, and the importance of support during treatment. CSW informed patient of the support team and support services at [facility]. CSW provided contact information and encouraged patient to call with any questions or concerns.  ONCBCN DISTRESS SCREENING 11/29/2017  Screening Type Initial Screening  Distress experienced in past week (1-10) 2  Practical problem type Work/school  Emotional problem type Nervousness/Anxiety  Information Concerns Type Lack of info about diagnosis     Johnnye Lana, MSW, LCSW, OSW-C Clinical Social Worker [place] [phone]

## 2017-11-29 NOTE — Patient Instructions (Signed)

## 2017-11-29 NOTE — Progress Notes (Signed)
Nutrition Assessment  Reason for Assessment:  Pt seen in Breast Clinic  ASSESSMENT:   62 year old female with new diagnosis of breast cancer.  Past medical history reviewed.  Patient reports normal appetite  Medications:  reviewed  Labs: reviewed  Anthropometrics:   Height: not measured Weight: 143 lb 14.4 oz  Patient reports stable weight.   NUTRITION DIAGNOSIS: Food and nutrition related knowledge deficit related to new diagnosis of breast cancer as evidenced by no prior need for nutrition related information.  INTERVENTION:   Discussed and provided packet of information regarding nutritional tips for breast cancer patients.  Questions answered.  Teachback method used.  Contact information provided and patient knows to contact me with questions/concerns.    MONITORING, EVALUATION, and GOAL: Pt will consume a healthy plant based diet to maintain lean body mass throughout treatment.   Shanica Castellanos B. Zenia Resides, [place], [place] Registered Dietitian [phone] (pager)

## 2017-11-29 NOTE — Therapy (Signed)
[place], [place], [address] Phone: [phone]   Fax:  [phone]  Physical Therapy Evaluation  Patient Details  Name: Lisa Valenzuela MRN: [id number] Date of Birth: Jul 28, 1956 Referring Provider: Dr. Alphonsa Overall   Encounter Date: 11/29/2017  PT End of Session - 11/29/17 1220    Number of Visits  2    Date for PT Re-Evaluation  01/24/18    PT Start Time  2947    PT Stop Time  1112    PT Time Calculation (min)  21 min    Activity Tolerance  Patient tolerated treatment well    Behavior During Therapy  [facility] for tasks assessed/performed       Past Medical History:  Diagnosis Date  . Vitamin D deficiency     History reviewed. No pertinent surgical history.  There were no vitals filed for this visit.   Subjective Assessment - 11/29/17 1214    Subjective  Patient presents to the [place] [facility] to be seen by her medical team for her newly diagnosed right breast cancer.    Patient is accompained by:  Family member    Pertinent History  Patient was diagnosed on 11/14/17 with right invasive lobular carcinoma breast cancer. It is an area of distortion in the upper inner quadrant. It is ER/PR positive and HEr2 negative with a Ki67 of 2%. She has no other medical problems.    Patient Stated Goals  Reduce lymphedema risk and learn post op shoulder ROM HEP    Currently in Pain?  No/denies         [facility] PT Assessment - 11/29/17 0001      Assessment   Medical Diagnosis  Right breast cancer    Referring Provider  Dr. Alphonsa Overall    Onset Date/Surgical Date  11/14/17    Hand Dominance  Right    Prior Therapy  none      Precautions   Precautions  Other (comment)    Precaution Comments  active cancer      Restrictions   Weight Bearing Restrictions  No      Balance Screen   Has the patient fallen in the past 6 months  No    Has the patient had a decrease in activity level because of a fear  of falling?   No    Is the patient reluctant to leave their home because of a fear of falling?   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, petroleum    Leisure  She does not exercise      Cognition   Overall Cognitive Status  Within Functional Limits for tasks assessed      Posture/Postural Control   Posture/Postural Control  Postural limitations    Postural Limitations  Rounded Shoulders;Forward head      ROM / Strength   AROM / PROM / Strength  AROM;Strength      AROM   AROM Assessment Site  Shoulder;Cervical    Right/Left Shoulder  Right;Left    Right Shoulder Extension  64 Degrees    Right Shoulder Flexion  155 Degrees  Right Shoulder ABduction  170 Degrees    Right Shoulder Internal Rotation  64 Degrees    Right Shoulder External Rotation  90 Degrees    Left Shoulder Extension  53 Degrees    Left Shoulder Flexion  160 Degrees    Left Shoulder ABduction  174 Degrees    Left Shoulder Internal Rotation  72 Degrees    Left Shoulder External Rotation  90 Degrees    Cervical Flexion  WNL    Cervical Extension  WNL    Cervical - Right Side Bend  WNL    Cervical - Left Side Bend  WNL    Cervical - Right Rotation  WNL    Cervical - Left Rotation  WNL      Strength   Overall Strength  Within functional limits for tasks performed        LYMPHEDEMA/ONCOLOGY QUESTIONNAIRE - 11/29/17 1219      Type   Cancer Type  Right breast cancer      Lymphedema Assessments   Lymphedema Assessments  Upper extremities      Right Upper Extremity Lymphedema   10 cm Proximal to Olecranon Process  26.5 cm    Olecranon Process  23.6 cm    10 cm Proximal to Ulnar Styloid Process  19.4 cm    Just Proximal to Ulnar Styloid Process   13.8 cm    Across Hand at [employer]  17.3 cm    At [place] of 2nd Digit  6.2 cm      Left Upper Extremity Lymphedema   10 cm Proximal to Olecranon Process  27.5 cm    Olecranon Process  23.7 cm    10 cm Proximal to Ulnar Styloid Process  20.1 cm    Just Proximal to Ulnar Styloid Process  13.7 cm    Across Hand at [employer]  18.3 cm    At [place] of 2nd Digit  6 cm          Objective measurements completed on examination: See above findings.     Patient was instructed today in a home exercise program today for post op shoulder range of motion. These included active assist shoulder flexion in sitting, scapular retraction, wall walking with shoulder abduction, and hands behind head external rotation.  She was encouraged to do these twice a day, holding 3 seconds and repeating 5 times when permitted by her physician.      PT Education - 11/29/17 1220    Education provided  Yes    Education Details  Lymphedema risk reduction and post op shoulder ROM HEP    Person(s) Educated  Patient;Spouse    Methods  Explanation;Demonstration;Handout    Comprehension  Returned demonstration;Verbalized understanding          PT Long Term Goals - 11/29/17 1223      PT LONG TERM GOAL #1   Title  Patient will demonstrate she has returned to baseline related to shoulder function and ROM since surgery.    Time  8    Period  Weeks    Status  New      Breast Clinic Goals - 11/29/17 1223      Patient will be able to verbalize understanding of pertinent lymphedema risk reduction practices relevant to her diagnosis specifically related to skin care.   Time  1    Period  Days    Status  Achieved      Patient will be able to return demonstrate  and/or verbalize understanding of the post-op home exercise program related to regaining shoulder range of motion.   Time  1    Period  Days    Status  Achieved      Patient will be able to verbalize understanding of the importance of attending  the postoperative After Breast Cancer Class for further lymphedema risk reduction education and therapeutic exercise.   Time  1    Period  Days    Status  Achieved            Plan - 11/29/17 1221    Clinical Impression Statement  Patient was diagnosed on 11/14/17 with right invasive lobular carcinoma breast cancer. It is an area of distortion in the upper inner quadrant. It is ER/PR positive and HEr2 negative with a Ki67 of 2%. She has no other medical problems. Her multidisciplinary medical team met prior to her assessments to determine a recommended treatment plan. She is planning to have an MRI followed by a right lumpectomy and sentinel node biopsy, Oncotype testing, radiation, and anti-estrogen therapy. She will benefit from a post op PT viist to determine needs.    History and Personal Factors relevant to plan of care:  None    Clinical Presentation  Stable    Clinical Decision Making  Low    Rehab Potential  Excellent    Clinical Impairments Affecting Rehab Potential  None    PT Frequency  -- Eval and 1 f/u visit    PT Treatment/Interventions  ADLs/Self Care Home Management;Therapeutic exercise;Patient/family education    PT Next Visit Plan  Will reassess 3-4 weeks post op    PT Home Exercise Plan  Post op shoulder ROM HEP    Consulted and Agree with Plan of Care  Patient;Family member/caregiver    Family Member Consulted  Husband       Patient will benefit from skilled therapeutic intervention in order to improve the following deficits and impairments:  Pain, Postural dysfunction, Decreased range of motion, Decreased knowledge of precautions, Impaired UE functional use  Visit Diagnosis: Malignant neoplasm of upper-inner quadrant of right breast in female, estrogen receptor positive ([place]) - Plan: PT plan of care cert/re-cert  Abnormal posture - Plan: PT plan of care cert/re-cert   Patient will follow up at outpatient cancer rehab 3-4 weeks following surgery.  If the patient  requires physical therapy at that time, a specific plan will be dictated and sent to the referring physician for approval. The patient was educated today on appropriate basic range of motion exercises to begin post operatively and the importance of attending the After Breast Cancer class following surgery.  Patient was educated today on lymphedema risk reduction practices as it pertains to recommendations that will benefit the patient immediately following surgery.  She verbalized good understanding.      Problem List Patient Active Problem List   Diagnosis Date Noted  . Malignant neoplasm of upper-inner quadrant of right breast in female, estrogen receptor positive ([place]) 11/28/2017    Annia Friendly, PT 11/29/17 12:25 PM  [place], [place], [address] Phone: [phone]   Fax:  [phone]  Name: Katrinia Straker MRN: [id number] Date of Birth: Jan 16, 1956

## 2017-11-30 ENCOUNTER — Ambulatory Visit
Admission: RE | Admit: 2017-11-30 | Discharge: 2017-11-30 | Disposition: A | Discharge status: Home / Self Care | Payer: BLUE CROSS/BLUE SHIELD | Source: Ambulatory Visit | Attending: Surgery | Admitting: Surgery

## 2017-11-30 DIAGNOSIS — Z170 Estrogen receptor positive status [ER+]: Principal | ICD-10-CM

## 2017-11-30 DIAGNOSIS — D0511 Intraductal carcinoma in situ of right breast: Secondary | ICD-10-CM | POA: Diagnosis not present

## 2017-11-30 DIAGNOSIS — C50211 Malignant neoplasm of upper-inner quadrant of right female breast: Secondary | ICD-10-CM

## 2017-11-30 MED ORDER — GADOBENATE DIMEGLUMINE 529 MG/ML IV SOLN
13.0000 mL | Freq: Once | INTRAVENOUS | Status: AC | PRN
  Administered 2017-11-30: 13 mL via INTRAVENOUS

## 2017-12-01 ENCOUNTER — Ambulatory Visit: Payer: BLUE CROSS/BLUE SHIELD

## 2017-12-01 ENCOUNTER — Inpatient Hospital Stay: Payer: BLUE CROSS/BLUE SHIELD

## 2017-12-01 ENCOUNTER — Encounter: Payer: Self-pay | Admitting: Genetic Counselor

## 2017-12-01 ENCOUNTER — Inpatient Hospital Stay (HOSPITAL_BASED_OUTPATIENT_CLINIC_OR_DEPARTMENT_OTHER): Payer: BLUE CROSS/BLUE SHIELD | Admitting: Genetic Counselor

## 2017-12-01 DIAGNOSIS — C50211 Malignant neoplasm of upper-inner quadrant of right female breast: Secondary | ICD-10-CM

## 2017-12-01 DIAGNOSIS — Z170 Estrogen receptor positive status [ER+]: Secondary | ICD-10-CM | POA: Diagnosis not present

## 2017-12-01 DIAGNOSIS — Z803 Family history of malignant neoplasm of breast: Secondary | ICD-10-CM

## 2017-12-01 DIAGNOSIS — Z809 Family history of malignant neoplasm, unspecified: Secondary | ICD-10-CM | POA: Diagnosis not present

## 2017-12-01 DIAGNOSIS — Z800 Family history of malignant neoplasm of digestive organs: Secondary | ICD-10-CM | POA: Diagnosis not present

## 2017-12-01 NOTE — Progress Notes (Signed)
[facility]      Initial Visit   Patient Name: Lisa Valenzuela Patient DOB: Jul 09, 1956 Patient Age: 62 y.o. Encounter Date: 12/01/2017  Referring Provider: Lurline Del, MD  Primary Care Provider: Fanny Bien, MD  Reason for Visit: Evaluate for hereditary susceptibility to cancer    Assessment and Plan:  . Lisa Valenzuela history is not highly suggestive of a hereditary predisposition to cancer, but she has two first-degree relatives with breast cancer, and hence, genetic evaluation is indicated. She also has a rather small family.  . Testing is recommended to determine whether she has a pathogenic mutation that will impact her screening and risk-reduction for cancer. A negative result will be reassuring.  . Lisa Valenzuela wished to pursue genetic testing and a blood sample will be sent for analysis of the 23 genes on [facility]'s Breast/GYN panel (ATM, BARD1, BRCA1, BRCA2, BRIP1, CDH1, CHEK2, DICER1, EPCAM, MLH1,  MSH2, MSH6, NBN, NF1, PALB2, PMS2, PTEN, RAD50, RAD51C, RAD51D,SMARCA4, STK11, and TP53).   Marland Kitchen Results should be available in approximately 2-4 weeks, at which point we will contact her and address implications for her as well as address genetic testing for at-risk family members, if needed.     Dr. Jana Hakim was available for questions concerning this case. Total time spent by me in face-to-face counseling was approximately 30 minutes.   _____________________________________________________________________   History of Present Illness: Lisa Valenzuela, a 62 y.o. female, is being seen at the [facility] due to a personal and family history of breast cancer. She presents to clinic today with her husband to discuss the possibility of a hereditary predisposition to cancer and discuss whether genetic testing is warranted.  Lisa Valenzuela was recently diagnosed with breast cancer at the age of 42. She stated that she has opted to proceed  with a lumpectomy, but is awaiting results of her breast MRI from yesterday. She indicated results of genetic testing will not impact this decision.      Malignant neoplasm of upper-inner quadrant of right breast in female, estrogen receptor positive ([place])   11/28/2017 Initial Diagnosis    Malignant neoplasm of upper-inner quadrant of right breast in female, estrogen receptor positive ([place])       Past Medical History:  Diagnosis Date  . Breast cancer ([place])   . Family history of breast cancer   . Vitamin D deficiency     No past surgical history on file.  Social History   Socioeconomic History  . Marital status: Married    Spouse name: Not on file  . Number of children: Not on file  . Years of education: Not on file  . Highest education level: Not on file  Social Needs  . Financial resource strain: Not on file  . Food insecurity - worry: Not on file  . Food insecurity - inability: Not on file  . Transportation needs - medical: Not on file  . Transportation needs - non-medical: Not on file  Occupational History  . Not on file  Tobacco Use  . Smoking status: Never Smoker  Substance and Sexual Activity  . Alcohol use: No    Frequency: Never  . Drug use: No  . Sexual activity: Not on file  Other Topics Concern  . Not on file  Social History Narrative  . Not on file     Family History:  During the visit, a 4-generation pedigree was obtained. Family tree will be scanned  in the Media tab in Epic  Significant diagnoses include the following:  Family History  Problem Relation Age of Onset  . Breast cancer Mother 35       currently 88  . Prostate cancer Father 48       currently 70  . Breast cancer Sister 40       currently 65  . Colon cancer Paternal Uncle        dx 35s; deceased 64s  . Bladder Cancer Maternal Grandmother        dx 93s; deceased 2s    Additionally, Lisa Valenzuela has a son (age 36) and a daughter (age 61). She has 2 sisters (one noted above) and 2  brothers. Her mother (noted above) was an only child. Her father had only two brothers.  Lisa Valenzuela ancestry is Caucasian - NOS. There is no known Jewish ancestry and no consanguinity.  Discussion: We reviewed the characteristics, features and inheritance patterns of hereditary cancer syndromes. We discussed her risk of harboring a mutation in the context of her personal and family history. We discussed that her smallmaternal family and paucity of women in her paternal family make risk assessment challenging. We discussed the process of genetic testing, insurance coverage and implications of results: positive, negative and variant of unknown significance (VUS).    Lisa Valenzuela questions were answered to her satisfaction today and she is welcome to call with any additional questions or concerns. Thank you for the referral and allowing [place] to share in the care of your patient.    Steele Berg, MS, [place] Certified Genetic Counselor phone: [phone] [email or web address]

## 2017-12-06 ENCOUNTER — Other Ambulatory Visit: Payer: Self-pay | Admitting: Surgery

## 2017-12-06 DIAGNOSIS — R9389 Abnormal findings on diagnostic imaging of other specified body structures: Secondary | ICD-10-CM

## 2017-12-06 DIAGNOSIS — N6320 Unspecified lump in the left breast, unspecified quadrant: Secondary | ICD-10-CM

## 2017-12-07 ENCOUNTER — Telehealth: Payer: Self-pay | Admitting: *Deleted

## 2017-12-07 ENCOUNTER — Other Ambulatory Visit: Payer: Self-pay | Admitting: Surgery

## 2017-12-07 NOTE — Telephone Encounter (Signed)
  Oncology Nurse Navigator Documentation  Navigator Location: [facility] (12/07/17 1300)   )Navigator Encounter Type: Telephone;MDC Follow-up (12/07/17 1300) Telephone: Outgoing Call;Clinic/MDC Follow-up (12/07/17 1300)                                                  Time Spent with Patient: 15 (12/07/17 1300)

## 2017-12-11 ENCOUNTER — Ambulatory Visit
Admission: RE | Admit: 2017-12-11 | Discharge: 2017-12-11 | Disposition: A | Discharge status: Home / Self Care | Payer: BLUE CROSS/BLUE SHIELD | Source: Ambulatory Visit | Attending: Surgery | Admitting: Surgery

## 2017-12-11 DIAGNOSIS — N6489 Other specified disorders of breast: Secondary | ICD-10-CM | POA: Diagnosis not present

## 2017-12-11 DIAGNOSIS — N6320 Unspecified lump in the left breast, unspecified quadrant: Secondary | ICD-10-CM

## 2017-12-13 ENCOUNTER — Other Ambulatory Visit: Payer: Self-pay | Admitting: Surgery

## 2017-12-13 DIAGNOSIS — R9389 Abnormal findings on diagnostic imaging of other specified body structures: Secondary | ICD-10-CM

## 2017-12-18 ENCOUNTER — Ambulatory Visit
Admission: RE | Admit: 2017-12-18 | Discharge: 2017-12-18 | Disposition: A | Discharge status: Home / Self Care | Payer: BLUE CROSS/BLUE SHIELD | Source: Ambulatory Visit | Attending: Surgery | Admitting: Surgery

## 2017-12-18 DIAGNOSIS — Z23 Encounter for immunization: Secondary | ICD-10-CM | POA: Diagnosis not present

## 2017-12-18 DIAGNOSIS — R9389 Abnormal findings on diagnostic imaging of other specified body structures: Secondary | ICD-10-CM

## 2017-12-18 DIAGNOSIS — Z1211 Encounter for screening for malignant neoplasm of colon: Secondary | ICD-10-CM | POA: Diagnosis not present

## 2017-12-18 DIAGNOSIS — Z0000 Encounter for general adult medical examination without abnormal findings: Secondary | ICD-10-CM | POA: Diagnosis not present

## 2017-12-18 DIAGNOSIS — N760 Acute vaginitis: Secondary | ICD-10-CM | POA: Diagnosis not present

## 2017-12-18 DIAGNOSIS — Z6826 Body mass index (BMI) 26.0-26.9, adult: Secondary | ICD-10-CM | POA: Diagnosis not present

## 2017-12-18 DIAGNOSIS — N6341 Unspecified lump in right breast, subareolar: Secondary | ICD-10-CM | POA: Diagnosis not present

## 2017-12-18 DIAGNOSIS — N6011 Diffuse cystic mastopathy of right breast: Secondary | ICD-10-CM | POA: Diagnosis not present

## 2017-12-18 DIAGNOSIS — Z13220 Encounter for screening for lipoid disorders: Secondary | ICD-10-CM | POA: Diagnosis not present

## 2017-12-18 DIAGNOSIS — N6489 Other specified disorders of breast: Secondary | ICD-10-CM | POA: Diagnosis not present

## 2017-12-18 MED ORDER — GADOBENATE DIMEGLUMINE 529 MG/ML IV SOLN
13.0000 mL | Freq: Once | INTRAVENOUS | Status: AC | PRN
  Administered 2017-12-18: 13 mL via INTRAVENOUS

## 2017-12-19 ENCOUNTER — Other Ambulatory Visit: Payer: Self-pay | Admitting: Oncology

## 2017-12-20 ENCOUNTER — Ambulatory Visit: Payer: Self-pay | Admitting: Surgery

## 2017-12-20 DIAGNOSIS — C50911 Malignant neoplasm of unspecified site of right female breast: Secondary | ICD-10-CM

## 2017-12-20 DIAGNOSIS — D242 Benign neoplasm of left breast: Secondary | ICD-10-CM

## 2017-12-20 DIAGNOSIS — Z170 Estrogen receptor positive status [ER+]: Principal | ICD-10-CM

## 2017-12-25 ENCOUNTER — Encounter: Payer: Self-pay | Admitting: Genetic Counselor

## 2017-12-25 ENCOUNTER — Ambulatory Visit: Payer: Self-pay | Admitting: Genetic Counselor

## 2017-12-25 DIAGNOSIS — Z1379 Encounter for other screening for genetic and chromosomal anomalies: Secondary | ICD-10-CM

## 2017-12-25 HISTORY — DX: Encounter for other screening for genetic and chromosomal anomalies: Z13.79

## 2017-12-25 NOTE — Progress Notes (Signed)
Cancer Genetics Clinic       Genetic Test Results    Patient Name: Lisa Valenzuela Patient DOB: [birth year] Patient Age: 62 y.o. Encounter Date: 12/25/2017  Referring Provider: Lurline Del, MD  Primary Care Provider: Fanny Bien, MD   Ms. Lisa Valenzuela was called today to discuss genetic test results. Please see the Genetics note from her visit on 12/01/2017 for a detailed discussion of her personal and family history.  Genetic Testing: At the time of Ms. Lisa Valenzuela' visit, she decided to pursue genetic testing of multiple genes associated with hereditary susceptibility to breast and gynecologic cancers. Testing included sequencing and deletion/duplication analysis. Testing did not reveal a pathogenic mutation in any of the genes analyzed.  A copy of the genetic test report will be scanned into Epic under the Media tab.  The genes analyzed were the 23 genes on [facility]'s Breast/GYN panel (ATM, BARD1, BRCA1, BRCA2, BRIP1, CDH1, CHEK2, DICER1, EPCAM, MLH1,  MSH2, MSH6, NBN, NF1, PALB2, PMS2, PTEN, RAD50, RAD51C, RAD51D,SMARCA4, STK11, and TP53).  Since the current test is not perfect, it is possible that there may be a gene mutation that current testing cannot detect, but that chance is small. It is possible that a different genetic factor, which has not yet been discovered or is not on this panel, is responsible for the cancer diagnoses in the family. Again, the likelihood of this is low. No additional testing is recommended at this time for Ms. Lisa Valenzuela.  Cancer Screening: These results suggest that Ms. Lisa Valenzuela' cancer was most likely not due to an inherited predisposition. Most cancers happen by chance and this test, along with details of her family history, suggests that her cancer falls into this category. She is recommended to follow the cancer screening guidelines provided by her physician.   Family Members: Family members are at some increased risk of developing cancer, over  the general population risk, simply due to the family history. Women are recommended to have a yearly mammogram beginning at age 58, a yearly clinical breast exam, a yearly gynecologic exam and perform monthly breast self-exams. Colon cancer screening is recommended to begin by age 28 in both men and women, unless there is a family history of colon cancer or colon polyps or an individual has a personal history to warrant initiating screening at a younger age.  Any relative who had cancer at a young age or had a particularly rare cancer may also wish to pursue genetic testing. Genetic counselors can be located in other cities, by visiting the website of the [employer] (ArtistMovie.se) and Field seismologist for a Dietitian by zip code.    Lastly, cancer genetics is a rapidly advancing field and it is possible that new genetic tests will be appropriate for Ms. Lisa Valenzuela in the future. We encourage her to remain in contact with [place] on an annual basis so we can update her personal and family histories, and let her know of advances in cancer genetics that may benefit the family. Our contact number was provided. Ms. Lisa Valenzuela is welcome to call anytime with additional questions.     Steele Berg, MS, [place] Certified Genetic Counselor phone: [phone]

## 2017-12-28 ENCOUNTER — Other Ambulatory Visit: Payer: Self-pay | Admitting: Surgery

## 2017-12-28 DIAGNOSIS — D242 Benign neoplasm of left breast: Secondary | ICD-10-CM

## 2017-12-29 NOTE — Pre-Procedure Instructions (Signed)
Lisa Valenzuela  12/29/2017      [facility] [place], [place] - [address] [place] [address] [place] [address] [place] [address] [place] [address] [place] [address] Phone: [phone] Fax: [phone]    Your procedure is scheduled on Monday, March 25th     Report to [facility] Admitting at 5:30AM             (posted surgery time 7:30a - 9:30a)   Call this number if you have problems the morning of surgery:  [phone]   Remember:  Do not eat food or drink liquids after midnight, Sunday.              4-5 days prior to surgery, STOP TAKING ANY Vitamins, Herbal Supplements, Anti-inflammatories.   Take these medicines the morning of surgery with A SIP OF WATER : Claritin (if needed)   Do not wear jewelry, make-up or nail polish.  Do not wear lotions, powders,  perfumes, or deodorant.  Do not shave 48 hours prior to surgery.    Do not bring valuables to the hospital.  [facility] is not responsible for any belongings or valuables.  Contacts, dentures or bridgework may not be worn into surgery.  Leave your suitcase in the car.  After surgery it may be brought to your room.  For patients admitted to the hospital, discharge time will be determined by your treatment team.  Please read over the following fact sheets that you were given. Pain Booklet and Surgical Site Infection Prevention      [place]- Preparing For Surgery  Before surgery, you can play an important role. Because skin is not sterile, your skin needs to be as free of germs as possible. You can reduce the number of germs on your skin by washing with CHG (chlorahexidine gluconate) Soap before surgery.  CHG is an antiseptic cleaner which kills germs and bonds with the skin to continue killing germs even after washing.  Please do not use if you have an allergy to CHG or antibacterial soaps. If your skin becomes reddened/irritated stop using the CHG.  Do not shave (including legs and  underarms) for at least 48 hours prior to first CHG shower. It is OK to shave your face.  Please follow these instructions carefully.   1. Shower the NIGHT BEFORE SURGERY and the MORNING OF SURGERY with CHG.   2. If you chose to wash your hair, wash your hair first as usual with your normal shampoo.  3. After you shampoo, rinse your hair and body thoroughly to remove the shampoo.  4. Use CHG as you would any other liquid soap. You can apply CHG directly to the skin and wash gently with a scrungie or a clean washcloth.   5. Apply the CHG Soap to your body ONLY FROM THE NECK DOWN.  Do not use on open wounds or open sores. Avoid contact with your eyes, ears, mouth and genitals (private parts). Wash Face and genitals (private parts)  with your normal soap.  6. Wash thoroughly, paying special attention to the area where your surgery will be performed.  7. Thoroughly rinse your body with warm water from the neck down.  8. DO NOT shower/wash with your normal soap after using and rinsing off the CHG Soap.  9. Pat yourself dry with a CLEAN TOWEL.  10. Wear CLEAN PAJAMAS to bed the night before surgery, wear comfortable clothes the morning of  surgery  11. Place CLEAN SHEETS on your bed the night of your first shower and DO NOT SLEEP WITH PETS.    Day of Surgery: Do not apply any deodorants/lotions. Please wear clean clothes to the hospital/surgery center.

## 2018-01-01 ENCOUNTER — Encounter (HOSPITAL_COMMUNITY)
Admission: RE | Admit: 2018-01-01 | Discharge: 2018-01-01 | Disposition: A | Discharge status: Home / Self Care | Payer: BLUE CROSS/BLUE SHIELD | Source: Ambulatory Visit | Attending: Surgery | Admitting: Surgery

## 2018-01-01 ENCOUNTER — Other Ambulatory Visit: Payer: Self-pay

## 2018-01-01 ENCOUNTER — Encounter (HOSPITAL_COMMUNITY): Payer: Self-pay

## 2018-01-01 DIAGNOSIS — Z01812 Encounter for preprocedural laboratory examination: Secondary | ICD-10-CM | POA: Insufficient documentation

## 2018-01-01 LAB — BASIC METABOLIC PANEL
ANION GAP: 10 (ref 5–15)
BUN: 16 mg/dL (ref 6–20)
CHLORIDE: 105 mmol/L (ref 101–111)
CO2: 26 mmol/L (ref 22–32)
Calcium: 9.6 mg/dL (ref 8.9–10.3)
Creatinine, Ser: 0.79 mg/dL (ref 0.44–1.00)
GFR calc Af Amer: >60 mL/min (ref >60)
GFR calc non Af Amer: >60 mL/min (ref >60)
Glucose, Bld: 96 mg/dL (ref 65–99)
POTASSIUM: 3.8 mmol/L (ref 3.5–5.1)
SODIUM: 141 mmol/L (ref 135–145)

## 2018-01-01 LAB — CBC
HCT: 41.1 % (ref 36.0–46.0)
HEMOGLOBIN: 13.8 g/dL (ref 12.0–15.0)
MCH: 31.1 pg (ref 26.0–34.0)
MCHC: 33.6 g/dL (ref 30.0–36.0)
MCV: 92.6 fL (ref 78.0–100.0)
Platelets: 243 10*3/uL (ref 150–400)
RBC: 4.44 MIL/uL (ref 3.87–5.11)
RDW: 13.2 % (ref 11.5–15.5)
WBC: 7.3 10*3/uL (ref 4.0–10.5)

## 2018-01-01 NOTE — Progress Notes (Signed)
PCP is Dr. Deirdre Evener  LOV 11/2017 Denies murmur, htn, sob, cp.  Has never seen a cardio nor has had any cardiac testing.

## 2018-01-05 ENCOUNTER — Ambulatory Visit
Admission: RE | Admit: 2018-01-05 | Discharge: 2018-01-05 | Disposition: A | Discharge status: Home / Self Care | Payer: BLUE CROSS/BLUE SHIELD | Source: Ambulatory Visit | Attending: Surgery | Admitting: Surgery

## 2018-01-05 DIAGNOSIS — R928 Other abnormal and inconclusive findings on diagnostic imaging of breast: Secondary | ICD-10-CM | POA: Diagnosis not present

## 2018-01-05 DIAGNOSIS — D242 Benign neoplasm of left breast: Secondary | ICD-10-CM

## 2018-01-05 DIAGNOSIS — C50911 Malignant neoplasm of unspecified site of right female breast: Secondary | ICD-10-CM

## 2018-01-05 DIAGNOSIS — Z170 Estrogen receptor positive status [ER+]: Principal | ICD-10-CM

## 2018-01-07 NOTE — H&P (Signed)
Nance Pew  Location: [facility] Patient #: [id number] DOB: 1956-04-15 Undefined / Language: Cleophus Molt / Race: White Female  History of Present Illness   The patient is a 62 year old female who presents with a complaint of breast cancer.  The PCP is Dr. Deirdre Evener  The patient was referred by Dr. Terrilyn Saver  The pateint is at the Breast [facility] - Oncology is Drs. Magrinat and Lisbeth Renshaw  She comes with her husband, Kasandra Knudsen.  She came for her annual mammograms. She felt no mass or change in her breast. She's had no prior breast biopsies.  Mammograms: At [facility] - 11/17/2017 - 1. Architectural distortion involving the upper inner quadrant of the right breast without sonographic correlate. 2. No pathologic right axillary lymphadenopathy. 3. Benign cyst in the left breast at posterior depth which accounts for the screening mammographic finding. 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 her sister is a Marine scientist in [place] - she had bilateral mastectomies for DCIS On hormone therapy: No  I discussed the options for breast cancer treatment with the patient. The patient is at the [facility], which includes medical oncology and radiation oncology. I discussed the surgical options of lumpectomy vs. mastectomy. If mastectomy, there is the possibility of reconstruction. I discussed the options of lymph node biopsy. The treatment plan depends on the pathologic staging of the tumor and the patient's personal wishes. The risks of surgery include, but are not limited to, bleeding, infection, the need for further surgery, and nerve injury. The patient has been given literature on the treatment of breast cancer.  Plan: 1) MRI, 2) Right breast lumpectomy with right axillary SLNBx, 3) Oncotype, 4) Rad tx,  5) Andtiestrogen, 6) Genetics  Past Medical History: 1. Healthy 2. Colonoscopy 9 to 10 years ago 3.  Genetics - negative - from [place] History: She comes with her husband, Kasandra Knudsen. She has 2 children: Daughtger, Roni Bread - 80 yo, in [place], [place], and son Deretha Ertle, 46 yo, in [place].  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  Past Surgical History Walker Shadow Rich; 11/29/2017 8:58 AM) No pertinent past surgical history   Diagnostic Studies History (Suberina Rich; 11/29/2017 8:58 AM) Colonoscopy  5-10 years ago Mammogram  within last year Pap Smear  1-5 years ago  Medication History (Suberina Rich; 11/29/2017 8:58 AM) Medications Reconciled  Social History Walker Shadow Rich; 11/29/2017 8:58 AM) Caffeine use  Carbonated beverages, Coffee, Tea. No alcohol use  No drug use  Tobacco use  Never smoker.  Family History Walker Shadow Rich; 11/29/2017 8:58 AM) Arthritis  Father. Breast Cancer  Mother, Sister. Depression  Sister. Diabetes Mellitus  Mother, Son. Prostate Cancer  Father. Seizure disorder  Sister.   Review of Systems Walker Shadow Rich; 11/29/2017 8:58 AM) General Not Present- Appetite Loss, Chills, Fatigue, Fever, Night Sweats, Weight Gain and Weight Loss. Skin Not Present- Change in Wart/Mole, Dryness, Hives, Jaundice, New Lesions, Non-Healing Wounds, Rash and Ulcer. HEENT Present- Oral Ulcers, Seasonal Allergies and Wears glasses/contact lenses. Not Present- Earache, Hearing Loss, Hoarseness, Nose Bleed, Ringing in the Ears, Sinus Pain, Sore Throat, Visual Disturbances and Yellow Eyes. Respiratory Present- Snoring. Not Present- Bloody sputum, Chronic Cough, Difficulty Breathing and Wheezing. Breast Present- Breast Mass. Not Present- Breast Pain, Nipple Discharge and Skin Changes. Cardiovascular Not Present- Chest Pain, Difficulty  Breathing Lying Down, Leg Cramps, Palpitations, Rapid Heart Rate, Shortness of Breath and  Swelling of Extremities. Gastrointestinal Not Present- Abdominal Pain, Bloating, Bloody Stool, Change in Bowel Habits, Chronic diarrhea, Constipation, Difficulty Swallowing, Excessive gas, Gets full quickly at meals, Hemorrhoids, Indigestion, Nausea, Rectal Pain and Vomiting. Female Genitourinary Not Present- Frequency, Nocturia, Painful Urination, Pelvic Pain and Urgency. Musculoskeletal Not Present- Back Pain, Joint Pain, Joint Stiffness, Muscle Pain, Muscle Weakness and Swelling of Extremities. Neurological Not Present- Decreased Memory, Fainting, Headaches, Numbness, Seizures, Tingling, Tremor, Trouble walking and Weakness. Psychiatric Not Present- Anxiety, Bipolar, Change in Sleep Pattern, Depression, Fearful and Frequent crying. Endocrine Not Present- Cold Intolerance, Excessive Hunger, Hair Changes, Heat Intolerance, Hot flashes and New Diabetes. Hematology Not Present- Blood Thinners, Easy Bruising, Excessive bleeding, Gland problems, HIV and Persistent Infections.  Physical Exam: General: WN WF who is alert and generally healthy appearing.  HEENT: Normal. Pupils equal. Good dentition. Neck: Supple. No mass.  No thyroid mass.  Carotid pulse okay with no bruit. Lymph Nodes:  No supraclavicular, cervical, or axillary nodes.  Lungs: Clear to auscultation and symmetric breath sounds. Heart:  RRR. No murmur or rub.  Breast:  Right - Bruise in the UIQ       Left - No Mass  Abdomen: Soft. No mass. No tenderness. No hernia. Normal bowel sounds.   Rectal: Not done.  Extremities:  Good strength and ROM  in upper and lower extremities. Neurologic:  Grossly intact to motor and sensory function. Psychiatric: Has normal mood and affect. Behavior is normal.     Assessment & Plan 1.  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 Magrinat and Moody   Plan:  Right breast lumpectomy  (seed loc) and right axillary SLNBx  2.  Atypical papillary lesion in left breast  Addendum Note(Orel Cooler H. Jadalynn Burr MD; 12/03/2017 2:02 PM)   MRI on 11/30/2017 - 1) 1.3 x 1.7 cm hematoma at the at the known recent biopsy-proven site of malignancy. Subtle linear/clumped non mass enhancement extending from the inferior edge of the biopsy site 3 cm anteriorly.    2) 6 mm oval enhancing mass over the 6 o'clock position of the left retroareolar region.   Addendum Note(Bhavana Kady H. Lucia Gaskins MD; 12/14/2017 4:03 PM)   [place] of left breast - 12/11/2017 - IMPRESSION: No sonographic correlate for the MR finding in the 6 o'clock retroareolar region of the LEFT breast.   RECOMMENDATION:   1. MR guided core biopsy of mass in the retroareolar region of the LEFT breast.   2. MR guided core biopsy of non mass enhancement anterior to the biopsy site in the RIGHT breast.   Addendum Note(Katriel Cutsforth H. Crystle Carelli MD; 12/20/2017 4:15 PM)   MRI guided biopsy - 12/18/2017 - 1. ductal hyperplasia of the right breast, 2. atypical papillary lesion of left breast    I reviewed the films with Dr. Reynolds Bowl. He is still concerned about the anterior extent of the right breast cancer. I'll try to get a couple of cm anterior to the clip/mass. I need to take the dissection down to the chest wall on the right.   On the left side, there is an apprx 6 mm lesion retroareolar that will need to come out .... so bilateral seeds.   Alphonsa Overall, MD, [facility] Pager: [phone] Office phone:  [phone]

## 2018-01-07 NOTE — Anesthesia Preprocedure Evaluation (Addendum)
Anesthesia Evaluation  Patient identified by MRN, date of birth, ID band Patient awake    Reviewed: Allergy & Precautions, NPO status , Patient's Chart, lab work & pertinent test results  Airway Mallampati: II  TM Distance: >3 FB Neck ROM: Full    Dental  (+) Dental Advisory Given, Teeth Intact   Pulmonary neg pulmonary ROS,    Pulmonary exam normal breath sounds clear to auscultation       Cardiovascular Exercise Tolerance: Good negative cardio ROS Normal cardiovascular exam Rhythm:Regular Rate:Normal     Neuro/Psych negative neurological ROS  negative psych ROS   GI/Hepatic negative GI ROS, Neg liver ROS,   Endo/Other  negative endocrine ROS  Renal/GU negative Renal ROS  negative genitourinary   Musculoskeletal negative musculoskeletal ROS (+)   Abdominal   Peds  Hematology negative hematology ROS (+)   Anesthesia Other Findings Breast Cancer  Reproductive/Obstetrics                            Anesthesia Physical Anesthesia Plan  ASA: II  Anesthesia Plan: General   Post-op Pain Management:  Regional for Post-op pain   Induction: Intravenous  PONV Risk Score and Plan: 3 and Treatment may vary due to age or medical condition, Ondansetron, Dexamethasone and Midazolam  Airway Management Planned: LMA  Additional Equipment: None  Intra-op Plan:   Post-operative Plan: Extubation in OR  Informed Consent: I have reviewed the patients History and Physical, chart, labs and discussed the procedure including the risks, benefits and alternatives for the proposed anesthesia with the patient or authorized representative who has indicated his/her understanding and acceptance.   Dental advisory given  Plan Discussed with: CRNA and Anesthesiologist  Anesthesia Plan Comments:         Anesthesia Quick Evaluation

## 2018-01-08 ENCOUNTER — Ambulatory Visit (HOSPITAL_COMMUNITY): Payer: BLUE CROSS/BLUE SHIELD | Admitting: Anesthesiology

## 2018-01-08 ENCOUNTER — Ambulatory Visit (HOSPITAL_COMMUNITY)
Admission: RE | Admit: 2018-01-08 | Discharge: 2018-01-08 | Disposition: A | Discharge status: Home / Self Care | Payer: BLUE CROSS/BLUE SHIELD | Source: Ambulatory Visit | Attending: Surgery | Admitting: Surgery

## 2018-01-08 ENCOUNTER — Ambulatory Visit
Admission: RE | Admit: 2018-01-08 | Discharge: 2018-01-08 | Disposition: A | Discharge status: Home / Self Care | Payer: BLUE CROSS/BLUE SHIELD | Source: Ambulatory Visit | Attending: Surgery | Admitting: Surgery

## 2018-01-08 ENCOUNTER — Encounter (HOSPITAL_COMMUNITY)
Admission: RE | Disposition: A | Discharge status: Home / Self Care | Payer: Self-pay | Source: Ambulatory Visit | Attending: Surgery

## 2018-01-08 ENCOUNTER — Encounter (HOSPITAL_COMMUNITY): Payer: Self-pay | Admitting: *Deleted

## 2018-01-08 ENCOUNTER — Other Ambulatory Visit: Payer: Self-pay

## 2018-01-08 DIAGNOSIS — C50211 Malignant neoplasm of upper-inner quadrant of right female breast: Secondary | ICD-10-CM | POA: Insufficient documentation

## 2018-01-08 DIAGNOSIS — D242 Benign neoplasm of left breast: Secondary | ICD-10-CM | POA: Diagnosis not present

## 2018-01-08 DIAGNOSIS — Z170 Estrogen receptor positive status [ER+]: Secondary | ICD-10-CM | POA: Diagnosis not present

## 2018-01-08 DIAGNOSIS — C50911 Malignant neoplasm of unspecified site of right female breast: Secondary | ICD-10-CM | POA: Diagnosis not present

## 2018-01-08 DIAGNOSIS — R928 Other abnormal and inconclusive findings on diagnostic imaging of breast: Secondary | ICD-10-CM | POA: Diagnosis not present

## 2018-01-08 DIAGNOSIS — Z803 Family history of malignant neoplasm of breast: Secondary | ICD-10-CM | POA: Diagnosis not present

## 2018-01-08 DIAGNOSIS — G8918 Other acute postprocedural pain: Secondary | ICD-10-CM | POA: Diagnosis not present

## 2018-01-08 DIAGNOSIS — Z79899 Other long term (current) drug therapy: Secondary | ICD-10-CM | POA: Insufficient documentation

## 2018-01-08 HISTORY — PX: BREAST LUMPECTOMY WITH RADIOACTIVE SEED AND SENTINEL LYMPH NODE BIOPSY: SHX6550

## 2018-01-08 SURGERY — BREAST LUMPECTOMY WITH RADIOACTIVE SEED AND SENTINEL LYMPH NODE BIOPSY
Anesthesia: General | Site: Breast | Laterality: Bilateral

## 2018-01-08 MED ORDER — SODIUM CHLORIDE 0.9 % IJ SOLN
INTRAMUSCULAR | Status: AC
  Filled 2018-01-08: qty 10

## 2018-01-08 MED ORDER — BUPIVACAINE-EPINEPHRINE (PF) 0.5% -1:200000 IJ SOLN
INTRAMUSCULAR | Status: DC | PRN
  Administered 2018-01-08: 30 mL

## 2018-01-08 MED ORDER — ACETAMINOPHEN 500 MG PO TABS
1000.0000 mg | ORAL_TABLET | ORAL | Status: AC
  Administered 2018-01-08: 1000 mg via ORAL

## 2018-01-08 MED ORDER — PROPOFOL 10 MG/ML IV BOLUS
INTRAVENOUS | Status: DC | PRN
  Administered 2018-01-08: 160 mg via INTRAVENOUS

## 2018-01-08 MED ORDER — FENTANYL CITRATE (PF) 100 MCG/2ML IJ SOLN: 25.0000 ug | INTRAMUSCULAR | Status: DC | PRN

## 2018-01-08 MED ORDER — CHLORHEXIDINE GLUCONATE CLOTH 2 % EX PADS: 6.0000 | MEDICATED_PAD | Freq: Once | CUTANEOUS | Status: DC

## 2018-01-08 MED ORDER — METHYLENE BLUE 0.5 % INJ SOLN
INTRAVENOUS | Status: AC
  Filled 2018-01-08: qty 10

## 2018-01-08 MED ORDER — GABAPENTIN 300 MG PO CAPS
300.0000 mg | ORAL_CAPSULE | ORAL | Status: AC
  Administered 2018-01-08: 300 mg via ORAL

## 2018-01-08 MED ORDER — MIDAZOLAM HCL 2 MG/2ML IJ SOLN
INTRAMUSCULAR | Status: AC
  Filled 2018-01-08: qty 2

## 2018-01-08 MED ORDER — ACETAMINOPHEN 500 MG PO TABS
ORAL_TABLET | ORAL | Status: AC
  Administered 2018-01-08: 1000 mg via ORAL
  Filled 2018-01-08: qty 2

## 2018-01-08 MED ORDER — MIDAZOLAM HCL 2 MG/2ML IJ SOLN
INTRAMUSCULAR | Status: DC | PRN
  Administered 2018-01-08 (×2): 1 mg via INTRAVENOUS

## 2018-01-08 MED ORDER — LACTATED RINGERS IV SOLN
INTRAVENOUS | Status: DC | PRN
  Administered 2018-01-08 (×2): via INTRAVENOUS

## 2018-01-08 MED ORDER — CEFAZOLIN SODIUM-DEXTROSE 2-4 GM/100ML-% IV SOLN
INTRAVENOUS | Status: AC
  Filled 2018-01-08: qty 100

## 2018-01-08 MED ORDER — CEFAZOLIN SODIUM-DEXTROSE 2-4 GM/100ML-% IV SOLN
2.0000 g | INTRAVENOUS | Status: AC
  Administered 2018-01-08: 2 g via INTRAVENOUS

## 2018-01-08 MED ORDER — OXYCODONE HCL 5 MG PO TABS: 5.0000 mg | ORAL_TABLET | Freq: Once | ORAL | Status: DC | PRN

## 2018-01-08 MED ORDER — DEXAMETHASONE SODIUM PHOSPHATE 10 MG/ML IJ SOLN
INTRAMUSCULAR | Status: DC | PRN
  Administered 2018-01-08: 10 mg via INTRAVENOUS

## 2018-01-08 MED ORDER — FENTANYL CITRATE (PF) 250 MCG/5ML IJ SOLN
INTRAMUSCULAR | Status: AC
  Filled 2018-01-08: qty 5

## 2018-01-08 MED ORDER — TECHNETIUM TC 99M SULFUR COLLOID FILTERED
1.0000 | Freq: Once | INTRAVENOUS | Status: AC | PRN
  Administered 2018-01-08: 1 via INTRADERMAL

## 2018-01-08 MED ORDER — EPHEDRINE SULFATE 50 MG/ML IJ SOLN
INTRAMUSCULAR | Status: DC | PRN
  Administered 2018-01-08: 10 mg via INTRAVENOUS
  Administered 2018-01-08 (×3): 5 mg via INTRAVENOUS

## 2018-01-08 MED ORDER — ONDANSETRON HCL 4 MG/2ML IJ SOLN
INTRAMUSCULAR | Status: DC | PRN
  Administered 2018-01-08: 4 mg via INTRAVENOUS

## 2018-01-08 MED ORDER — LIDOCAINE 2% (20 MG/ML) 5 ML SYRINGE
INTRAMUSCULAR | Status: DC | PRN
  Administered 2018-01-08: 60 mg via INTRAVENOUS

## 2018-01-08 MED ORDER — FENTANYL CITRATE (PF) 250 MCG/5ML IJ SOLN
INTRAMUSCULAR | Status: DC | PRN
  Administered 2018-01-08: 25 ug via INTRAVENOUS
  Administered 2018-01-08: 50 ug via INTRAVENOUS
  Administered 2018-01-08: 25 ug via INTRAVENOUS

## 2018-01-08 MED ORDER — BUPIVACAINE-EPINEPHRINE 0.25% -1:200000 IJ SOLN
INTRAMUSCULAR | Status: DC | PRN
  Administered 2018-01-08: 15 mL

## 2018-01-08 MED ORDER — 0.9 % SODIUM CHLORIDE (POUR BTL) OPTIME
TOPICAL | Status: DC | PRN
  Administered 2018-01-08: 1000 mL

## 2018-01-08 MED ORDER — HYDROCODONE-ACETAMINOPHEN 5-325 MG PO TABS: 1.0000 | ORAL_TABLET | Freq: Four times a day (QID) | ORAL | 0 refills | Status: DC | PRN

## 2018-01-08 MED ORDER — PROMETHAZINE HCL 25 MG/ML IJ SOLN: 6.2500 mg | INTRAMUSCULAR | Status: DC | PRN

## 2018-01-08 MED ORDER — PHENYLEPHRINE HCL 10 MG/ML IJ SOLN
INTRAMUSCULAR | Status: DC | PRN
  Administered 2018-01-08: 25 ug/min via INTRAVENOUS

## 2018-01-08 MED ORDER — BUPIVACAINE-EPINEPHRINE (PF) 0.25% -1:200000 IJ SOLN
INTRAMUSCULAR | Status: AC
  Filled 2018-01-08: qty 30

## 2018-01-08 MED ORDER — PROPOFOL 10 MG/ML IV BOLUS
INTRAVENOUS | Status: AC
  Filled 2018-01-08: qty 20

## 2018-01-08 MED ORDER — GABAPENTIN 300 MG PO CAPS
ORAL_CAPSULE | ORAL | Status: AC
  Administered 2018-01-08: 300 mg via ORAL
  Filled 2018-01-08: qty 1

## 2018-01-08 MED ORDER — OXYCODONE HCL 5 MG/5ML PO SOLN: 5.0000 mg | Freq: Once | ORAL | Status: DC | PRN

## 2018-01-08 SURGICAL SUPPLY — 48 items
BINDER BREAST LRG (GAUZE/BANDAGES/DRESSINGS) ×2 IMPLANT
BINDER BREAST XLRG (GAUZE/BANDAGES/DRESSINGS) IMPLANT
BLADE SURG 15 STRL LF DISP TIS (BLADE) ×2 IMPLANT
BLADE SURG 15 STRL SS (BLADE) ×2
CANISTER SUCT 3000ML PPV (MISCELLANEOUS) ×2 IMPLANT
CHLORAPREP W/TINT 26ML (MISCELLANEOUS) ×2 IMPLANT
COVER PROBE W GEL 5X96 (DRAPES) ×2 IMPLANT
COVER SURGICAL LIGHT HANDLE (MISCELLANEOUS) ×2 IMPLANT
DERMABOND ADVANCED (GAUZE/BANDAGES/DRESSINGS) ×1
DERMABOND ADVANCED .7 DNX12 (GAUZE/BANDAGES/DRESSINGS) ×1 IMPLANT
DEVICE DUBIN SPECIMEN MAMMOGRA (MISCELLANEOUS) ×2 IMPLANT
DRAPE CHEST BREAST 15X10 FENES (DRAPES) ×2 IMPLANT
DRAPE ORTHO SPLIT 87X125 STRL (DRAPES) ×4 IMPLANT
DRAPE UTILITY XL STRL (DRAPES) ×2 IMPLANT
ELECT COATED BLADE 2.86 ST (ELECTRODE) ×2 IMPLANT
ELECT REM PT RETURN 9FT ADLT (ELECTROSURGICAL) ×2
ELECTRODE REM PT RTRN 9FT ADLT (ELECTROSURGICAL) ×1 IMPLANT
GAUZE SPONGE 4X4 12PLY STRL (GAUZE/BANDAGES/DRESSINGS) ×2 IMPLANT
GLOVE BIOGEL PI IND STRL 7.0 (GLOVE) ×2 IMPLANT
GLOVE BIOGEL PI INDICATOR 7.0 (GLOVE) ×2
GLOVE SURG SIGNA 7.5 PF LTX (GLOVE) ×4 IMPLANT
GLOVE SURG SS PI 6.0 STRL IVOR (GLOVE) ×4 IMPLANT
GOWN STRL REUS W/ TWL LRG LVL3 (GOWN DISPOSABLE) ×1 IMPLANT
GOWN STRL REUS W/ TWL XL LVL3 (GOWN DISPOSABLE) ×1 IMPLANT
GOWN STRL REUS W/TWL LRG LVL3 (GOWN DISPOSABLE) ×1
GOWN STRL REUS W/TWL XL LVL3 (GOWN DISPOSABLE) ×1
ILLUMINATOR WAVEGUIDE N/F (MISCELLANEOUS) IMPLANT
KIT BASIN OR (CUSTOM PROCEDURE TRAY) ×2 IMPLANT
KIT MARKER MARGIN INK (KITS) ×2 IMPLANT
LIGHT WAVEGUIDE WIDE FLAT (MISCELLANEOUS) IMPLANT
NDL SAFETY ECLIPSE 18X1.5 (NEEDLE) IMPLANT
NEEDLE FILTER BLUNT 18X 1/2SAF (NEEDLE)
NEEDLE FILTER BLUNT 18X1 1/2 (NEEDLE) IMPLANT
NEEDLE HYPO 18GX1.5 SHARP (NEEDLE)
NEEDLE HYPO 25GX1X1/2 BEV (NEEDLE) ×2 IMPLANT
NS IRRIG 1000ML POUR BTL (IV SOLUTION) ×2 IMPLANT
PACK GENERAL/GYN (CUSTOM PROCEDURE TRAY) ×2 IMPLANT
PACK SURGICAL SETUP 50X90 (CUSTOM PROCEDURE TRAY) ×2 IMPLANT
PENCIL BUTTON HOLSTER BLD 10FT (ELECTRODE) ×2 IMPLANT
SPONGE LAP 18X18 X RAY DECT (DISPOSABLE) ×2 IMPLANT
SUT MNCRL AB 4-0 PS2 18 (SUTURE) ×2 IMPLANT
SUT VIC AB 3-0 SH 8-18 (SUTURE) ×2 IMPLANT
SYR BULB 3OZ (MISCELLANEOUS) ×2 IMPLANT
SYR CONTROL 10ML LL (SYRINGE) ×2 IMPLANT
TOWEL OR 17X24 6PK STRL BLUE (TOWEL DISPOSABLE) ×2 IMPLANT
TOWEL OR 17X26 10 PK STRL BLUE (TOWEL DISPOSABLE) ×2 IMPLANT
TUBE CONNECTING 12X1/4 (SUCTIONS) ×2 IMPLANT
YANKAUER SUCT BULB TIP NO VENT (SUCTIONS) ×2 IMPLANT

## 2018-01-08 NOTE — Anesthesia Postprocedure Evaluation (Signed)
Anesthesia Post Note  Patient: Lisa Valenzuela  Procedure(s) Performed: BILATERAL BREAST LUMPECTOMY WITH BILATERAL  RADIOACTIVE SEEDS AND RIGHT  SENTINEL LYMPH NODE BIOPSY (Bilateral Breast)     Patient location during evaluation: PACU Anesthesia Type: General Level of consciousness: awake and alert Pain management: pain level controlled Vital Signs Assessment: post-procedure vital signs reviewed and stable Respiratory status: spontaneous breathing, nonlabored ventilation and respiratory function stable Cardiovascular status: blood pressure returned to baseline and stable Postop Assessment: no apparent nausea or vomiting Anesthetic complications: no    Last Vitals:  Vitals:   01/08/18 1000 01/08/18 1010  BP: 126/75 123/82  Pulse: 95 86  Resp: 18 16  Temp: (!) 36.4 C   SpO2: 99% 100%    Last Pain:  Vitals:   01/08/18 1010  TempSrc:   PainSc: 0-No pain                 Audry Pili

## 2018-01-08 NOTE — Transfer of Care (Signed)
Immediate Anesthesia Transfer of Care Note  Patient: Lisa Valenzuela  Procedure(s) Performed: BILATERAL BREAST LUMPECTOMY WITH BILATERAL  RADIOACTIVE SEEDS AND RIGHT  SENTINEL LYMPH NODE BIOPSY (Bilateral Breast)  Patient Location: PACU  Anesthesia Type:General  Level of Consciousness: drowsy  Airway & Oxygen Therapy: Patient Spontanous Breathing and Patient connected to nasal cannula oxygen  Post-op Assessment: Report given to RN and Post -op Vital signs reviewed and stable  Post vital signs: Reviewed and stable  Last Vitals:  Vitals Value Taken Time  BP 104/59 01/08/2018  9:31 AM  Temp    Pulse 69 01/08/2018  9:32 AM  Resp 16 01/08/2018  9:32 AM  SpO2 100 % 01/08/2018  9:32 AM  Vitals shown include unvalidated device data.  Last Pain:  Vitals:   01/08/18 0624  TempSrc:   PainSc: 0-No pain      Patients Stated Pain Goal: 3 (86/77/37 3668)  Complications: No apparent anesthesia complications

## 2018-01-08 NOTE — Interval H&P Note (Signed)
History and Physical Interval Note:  01/08/2018 7:26 AM  Lisa Valenzuela  has presented today for surgery, with the diagnosis of RIGHT BREAST CANCER, LEFT BREAST ATYPICAL PAPILLOMA  The various methods of treatment have been discussed with the patient and family.    Husband, children, and parents are here.  After consideration of risks, benefits and other options for treatment, the patient has consented to  Procedure(s): BILATERAL BREAST LUMPECTOMY WITH BILATERAL  RADIOACTIVE SEEDS AND RIGHT  SENTINEL LYMPH NODE BIOPSY (Bilateral) as a surgical intervention .  The patient's history has been reviewed, patient examined, no change in status, stable for surgery.  I have reviewed the patient's chart and labs.  Questions were answered to the patient's satisfaction.     Shann Medal

## 2018-01-08 NOTE — Anesthesia Procedure Notes (Signed)
Anesthesia Regional Block: Pectoralis block   Pre-Anesthetic Checklist: ,, timeout performed, Correct Patient, Correct Site, Correct Laterality, Correct Procedure, Correct Position, site marked, Risks and benefits discussed,  Surgical consent,  Pre-op evaluation,  At surgeon's request and post-op pain management  Laterality: Right  Prep: chloraprep       Needles:  Injection technique: Single-shot  Needle Type: Echogenic Needle     Needle Length: 10cm  Needle Gauge: 21     Additional Needles:   Narrative:  Start time: 01/08/2018 7:07 AM End time: 01/08/2018 7:11 AM Injection made incrementally with aspirations every 5 mL.  Performed by: Personally  Anesthesiologist: Audry Pili, MD  Additional Notes: No pain on injection. No increased resistance to injection. Injection made in 5cc increments. Good needle visualization. Patient tolerated the procedure well.

## 2018-01-08 NOTE — Discharge Instructions (Signed)
[facility] - DISCHARGE INSTRUCTIONS TO PATIENT  Activity:  Driving - May drive in 2 or 3 days   Lifting - No lifting more than 15 pounds for 7 days, then no limit  Wound Care:   Leave binder on for 2 days, then you may removed the binder and shower.  Diet:  As tolerated.  Follow up appointment:  Call Dr. Pollie Friar office [facility]) at [phone] for an appointment in 2 to 3 weeks.  Medications and dosages:  Resume your home medications.  You have a prescription for:  Vicodin  Call Dr. Lucia Gaskins or his office  [phone]) if you have:  Temperature greater than 100.4,  Persistent nausea and vomiting,  Severe uncontrolled pain,  Redness, tenderness, or signs of infection (pain, swelling, redness, odor or green/yellow discharge around the site),  Difficulty breathing, headache or visual disturbances,  Any other questions or concerns you may have after discharge.  In an emergency, call 911 or go to an Emergency Department at a nearby hospital.

## 2018-01-08 NOTE — Op Note (Addendum)
01/08/2018  9:27 AM  PATIENT:  Lisa Valenzuela DOB: 12-17-1955 MRN: [id number]  PREOP DIAGNOSIS:   RIGHT BREAST CANCER, LEFT BREAST ATYPICAL PAPILLOMA  POSTOP DIAGNOSIS:    Right breast cancer, 1 o'clock position (T1, N0),  Left breast atypical papilloma (subareolar)  PROCEDURE:   Procedure(s): BILATERAL BREAST LUMPECTOMY WITH BILATERAL  RADIOACTIVE SEEDS AND RIGHT  SENTINEL LYMPH NODE BIOPSY, deep sentinel lymph node biopsy  SURGEON:   Alphonsa Overall, M.D.  ANESTHESIA:   general  Anesthesiologist: Audry Pili, MD CRNA: Bryson Corona, CRNA; Sammie Bench, CRNA  General  EBL:  minimal  ml  DRAINS:  none   LOCAL MEDICATIONS USED:   15 cc of 1/4% marcaine, right pectoral block by anesthesia  SPECIMEN:   Left breast lumpectomy (6 color paint), right breast lumpectomy (6 color paint), right axillary node biopsy (counts 1600, background 50)  COUNTS CORRECT:  YES  INDICATIONS FOR PROCEDURE:  Lisa Valenzuela is a 62 y.o. (DOB: February 04, 1956) white female whose primary care physician is Fanny Bien, MD and comes for right breast lumpectomy and right axillary sentinel lymph node biopsy for right breast cancer and a left breast lumpectomy for a retroareolar atypical papilloma.   She [facility] with D[facility]. Magrinat and [facility] for a infiltrating lobular carcinoma of the right.  The options for breast cancer treatment have been discussed with the patient. She elected to proceed with lumpectomy and axillary sentinel lymph node.    She also a biopsy of her left breast which showed an atypical papilloma that will be excised at the same time.    The indications and potential complications of surgery were explained to the patient. Potential complications include, but are not limited to, bleeding, infection, the need for further surgery, and nerve injury.     She had a I131 seed placed on 01/05/2018 in her bother her breast at [facility]..  The seeds are  in the 1 o'clock position of the right breast and retroareolar in the left breast..   In the holding area, her right areola was injected with 1 millicurie of Technitium Sulfur Colloid.  OPERATIVE NOTE:   The patient was taken to operating room # 2 at [facility] where she underwent a general anesthesia  supervised by Anesthesiologist: Audry Pili, MD CRNA: Bryson Corona, CRNA; Sammie Bench, CRNA.  Both her breasts and axilla were prepped with  ChloraPrep and sterilely draped.    A time-out and the surgical check list was reviewed.    First I excised the lesion under the left areola.  I made a LOQ circumareolar incision.  I then excised a block of tissue about 3 x 3 cm.  This included most of the subareolar tissue.  I did a specimen mammogram which showed the clip and seed in the center of the specimen.   I turned attention to the cancer which was about at the 1 o'clock position of the right breast.   I used the Neoprobe to identify the I131 seed.  I made my incision directly over the tumor in the UIQ of the right breast.  Dr. Enriqueta Shutter was worried about the tissue anterior to the clip - so I tried to excise all the tissue between the skin and pectoralis major.   I tried to excise an area around the tumor of at least 1 cm.    I excised this block of breast tissue approximately 4 cm by 4 cm  in diameter.   I painted the lumpectomy specimen with the 6 color paint kit and did a specimen mammogram which confirmed the mass, clip, and the seed were all in the right position in the specimen.  The specimen was sent to pathology who called back to confirm that they have the seed and the specimen.   I then started the right deep axillary sentinel lymph node biopsy. I made an incision in the right axilla.  I found a hot area at the junction of the breast and the pectoralis major muscle, deep in the axilla. I cut down and  identified a hot node that had counts of 1600 and the background has 50 counts.  I  checked her internal mammary nodes and supraclavicular nodes with the neoprobe and found no other hot area. The axillary node was then sent to pathology.    I then irrigated the wounds with saline. I infiltrated approximately 15 mL of 1/4% Marcaine between the incisions. I placed 4 clips to mark right breast biopsy cavity, at 12, 3, 6, and 9 o'clock.  I then closed all the wounds in layers using 3-0 Vicryl sutures for the deep layer. At the skin, I closed the incisions with a 4-0 Monocryl suture. The incisions were then painted with Dermabond.  She had gauze place over the wounds and placed in a breast binder.   The patient tolerated the procedure well, was transported to the recovery room in good condition. Sponge and needle count were correct at the end of the case.   Final pathology is pending.   Alphonsa Overall, MD, [facility] Pager: [phone] Office phone:  [phone]

## 2018-01-08 NOTE — Anesthesia Procedure Notes (Signed)
Procedure Name: LMA Insertion Date/Time: 01/08/2018 7:42 AM Performed by: Bryson Corona, CRNA Pre-anesthesia Checklist: Patient identified, Emergency Drugs available, Suction available and Patient being monitored Patient Re-evaluated:Patient Re-evaluated prior to induction Oxygen Delivery Method: Circle System Utilized Preoxygenation: Pre-oxygenation with 100% oxygen Induction Type: IV induction Ventilation: Mask ventilation without difficulty LMA: LMA inserted LMA Size: 4.0 Number of attempts: 1 Placement Confirmation: positive ETCO2 Tube secured with: Tape Dental Injury: Teeth and Oropharynx as per pre-operative assessment

## 2018-01-09 ENCOUNTER — Encounter (HOSPITAL_COMMUNITY): Payer: Self-pay | Admitting: Surgery

## 2018-01-15 ENCOUNTER — Encounter: Payer: Self-pay | Admitting: *Deleted

## 2018-01-15 ENCOUNTER — Other Ambulatory Visit: Payer: Self-pay | Admitting: *Deleted

## 2018-01-15 DIAGNOSIS — Z23 Encounter for immunization: Secondary | ICD-10-CM | POA: Diagnosis not present

## 2018-01-15 DIAGNOSIS — C50211 Malignant neoplasm of upper-inner quadrant of right female breast: Secondary | ICD-10-CM

## 2018-01-15 DIAGNOSIS — Z170 Estrogen receptor positive status [ER+]: Principal | ICD-10-CM

## 2018-01-15 NOTE — Progress Notes (Signed)
No oncotype ordered per Dr. Jana Hakim.  Patient aware.  Referral sent to Dr. Lisbeth Renshaw

## 2018-01-17 ENCOUNTER — Encounter: Payer: Self-pay | Admitting: Radiation Oncology

## 2018-01-25 NOTE — Progress Notes (Signed)
Location of Breast Cancer:Upper-inner quadrant of right breast in female    Histology per Pathology Report:  Diagnosis 12-18-17 1. Breast, right, needle core biopsy, UOQ - USUAL DUCTAL HYPERPLASIA - FIBROCYSTIC CHANGES INCLUDING APOCRINE METAPLASIA - NO MALIGNANCY IDENTIFIED 2. 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 Alphonsa Overall 1. Breast, lumpectomy, Left - INTRADUCTAL PAPILLOMA, SEE COMMENT. - USUAL DUCTAL HYPERPLASIA AND FIBROCYSTIC CHANGE. - NO MALIGNANCY IDENTIFIED. 2. Breast, lumpectomy, Right - INVASIVE LOBULAR CARCINOMA, GRADE 1, SPANNING 0.6 CM, SEE COMMENT. - LOBULAR NEOPLASIA (LOBULAR CARCINOMA IN SITU). - INVASIVE CARCINOMA IS 0.2-0.3 CM OF THE POSTERIOR MARGIN FOCALLY. - BIOPSY SITE. - SEE ONCOLOGY TABLE. 3. Lymph node, sentinel, biopsy, Right axillary - ONE OF ONE LYMPH NODES NEGATIVE FOR CARCINOMA (0/1). 4. Lymph node, sentinel, biopsy, Right axillary - ONE OF ONE LYMPH NODES NEGATIVE FOR CARCINOMA (0/1). Receptor Status: ER(95 %+), PR (30%+), Her2-neu (- ratio 1.32), Ki-(2%)    Past/Anticipated interventions by medical oncology, if   any: Dr. Vinie Sill  Chemotherapy No  (1) breast conserving surgery pending  (2) consider Oncotype depending on tumor size: Adjuvant chemotherapy not anticipated  (3) adjuvant radiation to follow  (4) antiestrogens to follow at the completion of local treatment  (5) 12-01-17  genetics testing negavive   Lymphedema issues, if any: No ROM  to right arm and left arm good Skin to right  breast and left breast skin healing without signs of infection.  Follow up visit with Dr. Alphonsa Overall 01-26-18, will see again in 6 months.  Pain issues, if any: No   SAFETY ISSUES:  Prior radiation? : No  Pacemaker/ICD? : No  Possible current pregnancy?:No  Is the patient on methotrexate? :No   Menarche 13 G2 P BC yes 1980-2000  Menopause  50    HRT No  Current Complaints / other details: Mother and sister breast cancer and prostate cancer father   Wt Readings from Last 3 Encounters:  02/01/18 143 lb 3.2 oz (65 kg)  01/08/18 143 lb (64.9 kg)  01/01/18 143 lb 6.4 oz (65 kg)  BP 138/74 (BP Location: Left Arm, Patient Position: Sitting, Cuff Size: Normal)   Pulse 64   Temp 98.4 F (36.9 C) (Oral)   Resp 18   Ht _0  (1.575 m)   Wt 143 lb 3.2 oz (65 kg)   SpO2 96%   BMI 26.19 kg/m    Georgena Spurling, RN 01/25/2018,6:27 PM

## 2018-01-31 ENCOUNTER — Other Ambulatory Visit: Payer: BLUE CROSS/BLUE SHIELD

## 2018-01-31 ENCOUNTER — Ambulatory Visit: Payer: BLUE CROSS/BLUE SHIELD | Admitting: Adult Health

## 2018-02-01 ENCOUNTER — Ambulatory Visit
Admission: RE | Admit: 2018-02-01 | Discharge: 2018-02-01 | Disposition: A | Discharge status: Home / Self Care | Payer: BLUE CROSS/BLUE SHIELD | Source: Ambulatory Visit | Attending: Radiation Oncology | Admitting: Radiation Oncology

## 2018-02-01 ENCOUNTER — Encounter: Payer: Self-pay | Admitting: Radiation Oncology

## 2018-02-01 ENCOUNTER — Other Ambulatory Visit: Payer: Self-pay

## 2018-02-01 VITALS — BP 138/74 | HR 64 | Temp 98.4°F | Resp 18 | Ht 62.0 in | Wt 143.2 lb

## 2018-02-01 DIAGNOSIS — Z170 Estrogen receptor positive status [ER+]: Secondary | ICD-10-CM | POA: Insufficient documentation

## 2018-02-01 DIAGNOSIS — Z803 Family history of malignant neoplasm of breast: Secondary | ICD-10-CM | POA: Insufficient documentation

## 2018-02-01 DIAGNOSIS — E559 Vitamin D deficiency, unspecified: Secondary | ICD-10-CM | POA: Insufficient documentation

## 2018-02-01 DIAGNOSIS — C50211 Malignant neoplasm of upper-inner quadrant of right female breast: Secondary | ICD-10-CM

## 2018-02-01 DIAGNOSIS — Z79899 Other long term (current) drug therapy: Secondary | ICD-10-CM | POA: Diagnosis not present

## 2018-02-01 DIAGNOSIS — Z9889 Other specified postprocedural states: Secondary | ICD-10-CM | POA: Diagnosis not present

## 2018-02-01 NOTE — Addendum Note (Signed)
Encounter addended by: Malena Edman, RN on: 02/01/2018 12:09 PM  Actions taken: Charge Capture section accepted

## 2018-02-01 NOTE — Progress Notes (Signed)
Radiation Oncology         [phone] ________________________________  Name: Lisa Valenzuela        MRN: [id number]  Date of Service: 02/01/2018 DOB: 1956-05-25  TJ:QZESP, Mechele Claude, MD  Magrinat, Virgie Dad, MD     REFERRING PHYSICIAN: Magrinat, Virgie Dad, MD   DIAGNOSIS: The encounter diagnosis was Malignant neoplasm of upper-inner quadrant of right breast in female, estrogen receptor positive ([place]).   HISTORY OF PRESENT ILLNESS: Lisa Valenzuela is a 62 y.o. female originally seen in the multidisciplinary breast clinic for a new diagnosis of right breast cancer. The patient was noted to have screening distortion on her mammography. She was found to have this abnormality in the upper inner quadrant, but diagnostic ultrasound did not detect correlate, or any axillary adenopathy. A tomoguided biopsy on 11/21/17 revealed a grade 2 invasive lobular carcinoma, ER/PR positive, HER2 negative, Ki 67 of 2%. She underwent bilateral breast MRI on 11/30/17 and this revealed a 1.3 x 1.7 cm hematoma in the previous biopsy site of the right breast, there was also a 6 mm enhancing mass at 6:00 in the left retroareolar region as well as a clumped group of nonenhancing linear changes in the right breast at the inferior edge of the biopsy site..  She underwent a repeat biopsy of the right breast which revealed usual ductal hyperplasia, fibrocystic change and no evidence of malignancy.  The left needle core biopsy of the retroareolar mass revealed atypical papillary lesions.  She was taken to the operating room on 01/08/2018 where she underwent bilateral lumpectomies with right sentinel lymph node biopsy.  Her left lumpectomy was consistent with intraductal papilloma, usual ductal hyperplasia and fibrocystic change was noted without evidence of malignancy.  The right breast revealed invasive lobular carcinoma, grade 1 spanning 6 mm, LCIS was noted and invasive carcinoma was 2-3 mm from the posterior margin focally.   Of the 2  lymph nodes sampled in the right axilla none contained evidence of cancer.  She comes today to discuss recommendations of adjuvant therapy.  Of note Dr. Jana Hakim did not order Oncotype as he does not feel that there is a role for chemotherapy in her situation.  PREVIOUS RADIATION THERAPY: No   PAST MEDICAL HISTORY:  Past Medical History:  Diagnosis Date  . Breast cancer ([place])   . Family history of breast cancer   . Genetic testing 12/25/2017   Breast/GYN panel (23 genes) @ [facility] - No pathogenic mutations detected  . Vitamin D deficiency        PAST SURGICAL HISTORY: Past Surgical History:  Procedure Laterality Date  . BREAST LUMPECTOMY WITH RADIOACTIVE SEED AND SENTINEL LYMPH NODE BIOPSY Bilateral 01/08/2018   Procedure: BILATERAL BREAST LUMPECTOMY WITH BILATERAL  RADIOACTIVE SEEDS AND RIGHT  SENTINEL LYMPH NODE BIOPSY;  Surgeon: Alphonsa Overall, MD;  Location: [facility];  Service: General;  Laterality: Bilateral;  . BREAST SURGERY     bilateral breast bx     FAMILY HISTORY:  Family History  Problem Relation Age of Onset  . Breast cancer Mother 61       currently 43  . Prostate cancer Father 35       currently 69  . Breast cancer Sister 68       currently 45  . Colon cancer Paternal Uncle        dx 33s; deceased 64s  . Bladder Cancer Maternal Grandmother        dx 59s; deceased 56s     SOCIAL  HISTORY:  reports that she has never smoked. She has never used smokeless tobacco. She reports that she does not drink alcohol or use drugs. The patient is married and lives in [place]. She works as an Optometrist. Her husband is a Designer, fashion/clothing.    ALLERGIES: Patient has no known allergies.   MEDICATIONS:  Current Outpatient Medications  Medication Sig Dispense Refill  . Biotin w/ Vitamins C & E (HAIR/SKIN/NAILS PO) Take 1 tablet by mouth daily.    Marland Kitchen BLACK COHOSH PO Take 1 capsule by mouth daily.    . Cholecalciferol (VITAMIN D3 PO) Take 1 tablet by mouth daily.    Marland Kitchen  HYDROcodone-acetaminophen (NORCO/VICODIN) 5-325 MG tablet Take 1 tablet by mouth every 6 (six) hours as needed for moderate pain. 20 tablet 0  . loratadine (CLARITIN) 10 MG tablet Take 10 mg by mouth daily.    . minocycline (DYNACIN) 50 MG tablet Take 50 mg by mouth daily as needed. For rosacea  1  . Multiple Vitamin (MULTIVITAMIN WITH MINERALS) TABS tablet Take 1 tablet by mouth daily. Centrum Women's 50+     No current facility-administered medications for this encounter.      REVIEW OF SYSTEMS: On review of systems, the patient reports that she is doing well overall. She denies any chest pain, shortness of breath, cough, fevers, chills, night sweats, unintended weight changes. She denies any bowel or bladder disturbances, and denies abdominal pain, nausea or vomiting. She denies any new musculoskeletal or joint aches or pains. A complete review of systems is obtained and is otherwise negative.     PHYSICAL EXAM:  Wt Readings from Last 3 Encounters:  01/08/18 143 lb (64.9 kg)  01/01/18 143 lb 6.4 oz (65 kg)  11/29/17 143 lb 14.4 oz (65.3 kg)   Temp Readings from Last 3 Encounters:  01/08/18 (!) 97.5 F (36.4 C)  01/01/18 97.8 F (36.6 C)  11/29/17 (!) 97.5 F (36.4 C) (Oral)   BP Readings from Last 3 Encounters:  01/08/18 123/82  01/01/18 139/79  11/29/17 (!) 157/83   Pulse Readings from Last 3 Encounters:  01/08/18 86  01/01/18 68  11/29/17 87    In general this is a well appearing caucasian  female in no acute distress. She is alert and oriented x4 and appropriate throughout the examination. HEENT reveals that the patient is normocephalic, atraumatic. EOMs are intact. Cardiopulmonary assessment is negative for acute distress and she exhibits normal effort. The right breast lumpectomy site is intact and suture knot is noted laterally, no erythema or fullness is noted of the lumpectomy or axillary incision sites.  ECOG = 0  0 - Asymptomatic (Fully active, able to carry on  all predisease activities without restriction)  1 - Symptomatic but completely ambulatory (Restricted in physically strenuous activity but ambulatory and able to carry out work of a light or sedentary nature. For example, light housework, office work)  2 - Symptomatic, <50% in bed during the day (Ambulatory and capable of all self care but unable to carry out any work activities. Up and about more than 50% of waking hours)  3 - Symptomatic, >50% in bed, but not bedbound (Capable of only limited self-care, confined to bed or chair 50% or more of waking hours)  4 - Bedbound (Completely disabled. Cannot carry on any self-care. Totally confined to bed or chair)  5 - Death   Eustace Pen MM, Creech RH, Tormey DC, et al. [phone]). "Toxicity and response criteria of the [facility] Group".  [place] Oncol. 5 (6): 649-55    LABORATORY DATA:  Lab Results  Component Value Date   WBC 7.3 01/01/2018   HGB 13.8 01/01/2018   HCT 41.1 01/01/2018   MCV 92.6 01/01/2018   PLT 243 01/01/2018   Lab Results  Component Value Date   NA 141 01/01/2018   K 3.8 01/01/2018   CL 105 01/01/2018   CO2 26 01/01/2018   Lab Results  Component Value Date   ALT 20 11/29/2017   AST 22 11/29/2017   ALKPHOS 90 11/29/2017   BILITOT 0.5 11/29/2017      RADIOGRAPHY: Nm Sentinel Node Inj-no Rpt (breast)  Result Date: 01/08/2018 Sulfur colloid was injected by the nuclear medicine technologist for melanoma sentinel node.   Mm Breast Surgical Specimen  Result Date: 01/08/2018 CLINICAL DATA:  Evaluate surgical specimen following RIGHT lumpectomy for breast cancer. EXAM: SPECIMEN RADIOGRAPH OF THE RIGHT BREAST COMPARISON:  Previous exam(s). FINDINGS: Status post excision of the right breast. The radioactive seed and biopsy marker clip are present, completely intact, and were marked for pathology. IMPRESSION: Specimen radiograph of the right breast. Electronically Signed   By: Margarette Canada M.D.   On:  01/08/2018 08:42   Mm Breast Surgical Specimen  Result Date: 01/08/2018 CLINICAL DATA:  Specimen radiograph status post left breast excisional biopsy. EXAM: SPECIMEN RADIOGRAPH OF THE LEFT BREAST COMPARISON:  Previous exam(s). FINDINGS: Status post excision of the left breast. The radioactive seed and biopsy marker clip are present, completely intact, and were marked for pathology. These findings were communicated with the OR at 8:25 a.m. IMPRESSION: Specimen radiograph of the left breast. Electronically Signed   By: Ammie Ferrier M.D.   On: 01/08/2018 08:27   Mm Lt Radioactive Seed Loc Mammo Guide  Result Date: 01/05/2018 CLINICAL DATA:  Localization for surgery EXAM: MAMMOGRAPHIC GUIDED RADIOACTIVE SEED LOCALIZATION OF THE LEFT BREAST COMPARISON:  Previous exam(s). FINDINGS: Patient presents for radioactive seed localization prior to surgery. I met with the patient and we discussed the procedure of seed localization including benefits and alternatives. We discussed the high likelihood of a successful procedure. We discussed the risks of the procedure including infection, bleeding, tissue injury and further surgery. We discussed the low dose of radioactivity involved in the procedure. Informed, written consent was given. The usual time-out protocol was performed immediately prior to the procedure. Using mammographic guidance, sterile technique, 1% lidocaine and an I-125 radioactive seed, the biopsy clip was localized using a medial approach. The follow-up mammogram images confirm the seed in the expected location and were marked for the surgeon. Follow-up survey of the patient confirms presence of the radioactive seed. Order number of I-125 seed:  [id number]. Total activity:  3.545 millicuries reference Date: December 26, 2017 The patient tolerated the procedure well and was released from the [facility]. She was given instructions regarding seed removal. IMPRESSION: Radioactive seed localization left  breast. No apparent complications. Electronically Signed   By: Dorise Bullion III M.D   On: 01/05/2018 14:18   Mm Rt Radioactive Seed Loc Mammo Guide  Result Date: 01/05/2018 CLINICAL DATA:  Localize right-sided breast cancer. EXAM: MAMMOGRAPHIC GUIDED RADIOACTIVE SEED LOCALIZATION OF THE RIGHT BREAST COMPARISON:  Previous exam(s). FINDINGS: Patient presents for radioactive seed localization prior to surgery. I met with the patient and we discussed the procedure of seed localization including benefits and alternatives. We discussed the high likelihood of a successful procedure. We discussed the risks of the procedure including infection, bleeding, tissue injury and  further surgery. We discussed the low dose of radioactivity involved in the procedure. Informed, written consent was given. The usual time-out protocol was performed immediately prior to the procedure. Using mammographic guidance, sterile technique, 1% lidocaine and an I-125 radioactive seed, the region of the patient's cancer was localized using a superior approach. The follow-up mammogram images confirm the seed is in expected location on the 90 degree lateral view, approximately 1 cm superior to the biopsy clip. The clip is located 9 mm medial and anterior to the the biopsy clip on the CC view and approximately 4 mm from the edge of the patient's malignancy. Follow-up survey of the patient confirms presence of the radioactive seed. Order number of I-125 seed:  [id number]. Total activity:  0.250 reference Date: December 04, 2017 The patient tolerated the procedure well and was released from the [facility]. She was given instructions regarding seed removal. IMPRESSION: Radioactive seed localization right breast. The seed is located just superior to the biopsy clip as expected. There was inferior migration of the clip at surgery. On the CC view, the clip is located 9 mm anterior and medial to the biopsy clip and probably 4 mm from the edge of the  known malignancy. Electronically Signed   By: Dorise Bullion III M.D   On: 01/05/2018 14:17       IMPRESSION/PLAN: 1. Stage IA, pT1bN0M0, grade 1, ER/PR positive invasive lobular carcinoma with LCIS of the right breast. We reviewed the the nature of invasive breast disease and her final pathology results. She's healing well and is ready to proceed with adjuvant external radiotherapy to the breast followed by antiestrogen therapy. We discussed the risks, benefits, short, and long term effects of radiotherapy, and the patient is interested in proceeding. Dr. Lisbeth Renshaw previously recommended 4 weeks of therapy and she is in agreement to proceed. Written consent is obtained and placed in the chart, a copy was provided to the patient. She will be contacted by our staff to coordinate simulation.  In a visit lasting 25 minutes, greater than 50% of the time was spent face to face discussing her case, and coordinating the patient's care.    Carola Rhine, PAC

## 2018-02-07 ENCOUNTER — Ambulatory Visit
Admission: RE | Admit: 2018-02-07 | Discharge: 2018-02-07 | Disposition: A | Discharge status: Home / Self Care | Payer: BLUE CROSS/BLUE SHIELD | Source: Ambulatory Visit | Attending: Radiation Oncology | Admitting: Radiation Oncology

## 2018-02-07 ENCOUNTER — Telehealth: Payer: Self-pay | Admitting: Oncology

## 2018-02-07 DIAGNOSIS — Z170 Estrogen receptor positive status [ER+]: Secondary | ICD-10-CM | POA: Diagnosis not present

## 2018-02-07 DIAGNOSIS — Z510 Encounter for antineoplastic radiation therapy: Secondary | ICD-10-CM | POA: Insufficient documentation

## 2018-02-07 DIAGNOSIS — C50211 Malignant neoplasm of upper-inner quadrant of right female breast: Secondary | ICD-10-CM | POA: Diagnosis not present

## 2018-02-07 MED ORDER — ALRA NON-METALLIC DEODORANT (RAD-ONC)
1.0000 "application " | Freq: Once | TOPICAL | Status: AC
  Administered 2018-02-07: 1 via TOPICAL

## 2018-02-07 MED ORDER — RADIAPLEXRX EX GEL
Freq: Once | CUTANEOUS | Status: AC
  Administered 2018-02-07: 17:00:00 via TOPICAL

## 2018-02-07 NOTE — Progress Notes (Signed)
Pt here for patient teaching.  Pt given Radiation and You booklet, skin care instructions, Alra deodorant and Radiaplex gel.  Reviewed areas of pertinence such as fatigue, hair loss and skin changes . Pt able to give teach back of to pat skin and use unscented/gentle soap,apply Radiaplex bid, avoid applying anything to skin within 4 hours of treatment and to use an electric razor if they must shave. Pt verbalizes understanding of information given and will contact nursing with any questions or concerns.     Cori Razor, RN

## 2018-02-07 NOTE — Progress Notes (Signed)
  Radiation Oncology         [phone]) [phone] ________________________________  Name: Brittany Osier MRN: [id number]  Date: 02/07/2018  DOB: 11-04-1955  Optical Surface Tracking Plan:  Since intensity modulated radiotherapy (IMRT) and 3D conformal radiation treatment methods are predicated on accurate and precise positioning for treatment, intrafraction motion monitoring is medically necessary to ensure accurate and safe treatment delivery.  The ability to quantify intrafraction motion without excessive ionizing radiation dose can only be performed with optical surface tracking. Accordingly, surface imaging offers the opportunity to obtain 3D measurements of patient position throughout IMRT and 3D treatments without excessive radiation exposure.  I am ordering optical surface tracking for this patient's upcoming course of radiotherapy. ________________________________  Kyung Rudd, MD 02/07/2018 6:38 PM    Reference:   Particia Jasper, et al. Surface imaging-based analysis of intrafraction motion for breast radiotherapy patients.Journal of [place], n. 6, nov. 2014. ISSN [id number].   Available at: <http://www.jacmp.org/index.php/jacmp/article/view/4957>.

## 2018-02-07 NOTE — Progress Notes (Signed)
  Radiation Oncology         (336) [phone] ________________________________  Name: Lisa Valenzuela MRN: [id number]  Date: 02/07/2018  DOB: 12/30/1955   DIAGNOSIS:     ICD-10-CM   1. Malignant neoplasm of upper-inner quadrant of right breast in female, estrogen receptor positive ([facility]) C50.211 hyaluronate sodium (RADIAPLEXRX) gel   I78.6 non-metallic deodorant (ALRA) 1 application    SIMULATION AND TREATMENT PLANNING NOTE  The patient presented for simulation prior to beginning her course of radiation treatment for her diagnosis of right-sided breast cancer. The patient was placed in a supine position on a breast board. A customized vac-lock bag was constructed and this complex treatment device will be used on a daily basis during her treatment. In this fashion, a CT scan was obtained through the chest area and an isocenter was placed near the chest wall within the breast.  The patient will be planned to receive a course of radiation initially to a dose of 42.5 Gy. This will consist of a whole breast radiotherapy technique. To accomplish this, 2 customized blocks have been designed which will correspond to medial and lateral whole breast tangent fields. This treatment will be accomplished at 2.5 Gy per fraction. A forward planning technique will also be evaluated to determine if this approach improves the plan. It is anticipated that the patient will then receive a 7.5 Gy boost to the seroma cavity which has been contoured. This will be accomplished at 2.5 Gy per fraction.   This initial treatment will consist of a 3-D conformal technique. The seroma has been contoured as the primary target structure. Additionally, dose volume histograms of both this target as well as the lungs and heart will also be evaluated. Such an approach is necessary to ensure that the target area is adequately covered while the nearby critical  normal structures are adequately spared.  Plan:  The final anticipated total dose  therefore will correspond to 50 Gy.    _______________________________   Jodelle Gross, MD, PhD

## 2018-02-07 NOTE — Telephone Encounter (Signed)
Called patient regarding 6/27

## 2018-02-08 ENCOUNTER — Encounter: Payer: BLUE CROSS/BLUE SHIELD | Admitting: Physical Therapy

## 2018-02-13 DIAGNOSIS — Z170 Estrogen receptor positive status [ER+]: Secondary | ICD-10-CM | POA: Diagnosis not present

## 2018-02-13 DIAGNOSIS — C50211 Malignant neoplasm of upper-inner quadrant of right female breast: Secondary | ICD-10-CM | POA: Diagnosis not present

## 2018-02-13 DIAGNOSIS — Z510 Encounter for antineoplastic radiation therapy: Secondary | ICD-10-CM | POA: Diagnosis not present

## 2018-02-14 ENCOUNTER — Ambulatory Visit: Payer: BLUE CROSS/BLUE SHIELD | Admitting: Radiation Oncology

## 2018-02-14 DIAGNOSIS — Z510 Encounter for antineoplastic radiation therapy: Secondary | ICD-10-CM | POA: Insufficient documentation

## 2018-02-14 DIAGNOSIS — C50211 Malignant neoplasm of upper-inner quadrant of right female breast: Secondary | ICD-10-CM | POA: Insufficient documentation

## 2018-02-14 DIAGNOSIS — Z170 Estrogen receptor positive status [ER+]: Secondary | ICD-10-CM | POA: Insufficient documentation

## 2018-02-15 ENCOUNTER — Ambulatory Visit
Admission: RE | Admit: 2018-02-15 | Discharge: 2018-02-15 | Disposition: A | Discharge status: Home / Self Care | Payer: BLUE CROSS/BLUE SHIELD | Source: Ambulatory Visit | Attending: Radiation Oncology | Admitting: Radiation Oncology

## 2018-02-15 DIAGNOSIS — C50211 Malignant neoplasm of upper-inner quadrant of right female breast: Secondary | ICD-10-CM | POA: Diagnosis not present

## 2018-02-15 DIAGNOSIS — Z510 Encounter for antineoplastic radiation therapy: Secondary | ICD-10-CM | POA: Diagnosis not present

## 2018-02-15 DIAGNOSIS — Z170 Estrogen receptor positive status [ER+]: Secondary | ICD-10-CM | POA: Diagnosis not present

## 2018-02-16 ENCOUNTER — Ambulatory Visit: Payer: BLUE CROSS/BLUE SHIELD | Admitting: Radiation Oncology

## 2018-02-16 ENCOUNTER — Ambulatory Visit
Admission: RE | Admit: 2018-02-16 | Discharge: 2018-02-16 | Disposition: A | Discharge status: Home / Self Care | Payer: BLUE CROSS/BLUE SHIELD | Source: Ambulatory Visit | Attending: Radiation Oncology | Admitting: Radiation Oncology

## 2018-02-16 DIAGNOSIS — C50211 Malignant neoplasm of upper-inner quadrant of right female breast: Secondary | ICD-10-CM | POA: Diagnosis not present

## 2018-02-16 DIAGNOSIS — Z510 Encounter for antineoplastic radiation therapy: Secondary | ICD-10-CM | POA: Diagnosis not present

## 2018-02-16 DIAGNOSIS — Z170 Estrogen receptor positive status [ER+]: Secondary | ICD-10-CM | POA: Diagnosis not present

## 2018-02-19 ENCOUNTER — Ambulatory Visit
Admission: RE | Admit: 2018-02-19 | Discharge: 2018-02-19 | Disposition: A | Discharge status: Home / Self Care | Payer: BLUE CROSS/BLUE SHIELD | Source: Ambulatory Visit | Attending: Radiation Oncology | Admitting: Radiation Oncology

## 2018-02-19 DIAGNOSIS — Z170 Estrogen receptor positive status [ER+]: Secondary | ICD-10-CM | POA: Diagnosis not present

## 2018-02-19 DIAGNOSIS — C50211 Malignant neoplasm of upper-inner quadrant of right female breast: Secondary | ICD-10-CM | POA: Diagnosis not present

## 2018-02-19 DIAGNOSIS — Z510 Encounter for antineoplastic radiation therapy: Secondary | ICD-10-CM | POA: Diagnosis not present

## 2018-02-20 ENCOUNTER — Ambulatory Visit
Admission: RE | Admit: 2018-02-20 | Discharge: 2018-02-20 | Disposition: A | Discharge status: Home / Self Care | Payer: BLUE CROSS/BLUE SHIELD | Source: Ambulatory Visit | Attending: Radiation Oncology | Admitting: Radiation Oncology

## 2018-02-20 DIAGNOSIS — Z170 Estrogen receptor positive status [ER+]: Secondary | ICD-10-CM | POA: Diagnosis not present

## 2018-02-20 DIAGNOSIS — Z510 Encounter for antineoplastic radiation therapy: Secondary | ICD-10-CM | POA: Diagnosis not present

## 2018-02-20 DIAGNOSIS — C50211 Malignant neoplasm of upper-inner quadrant of right female breast: Secondary | ICD-10-CM | POA: Diagnosis not present

## 2018-02-21 ENCOUNTER — Ambulatory Visit
Admission: RE | Admit: 2018-02-21 | Discharge: 2018-02-21 | Disposition: A | Discharge status: Home / Self Care | Payer: BLUE CROSS/BLUE SHIELD | Source: Ambulatory Visit | Attending: Radiation Oncology | Admitting: Radiation Oncology

## 2018-02-21 DIAGNOSIS — Z170 Estrogen receptor positive status [ER+]: Secondary | ICD-10-CM | POA: Diagnosis not present

## 2018-02-21 DIAGNOSIS — C50211 Malignant neoplasm of upper-inner quadrant of right female breast: Secondary | ICD-10-CM | POA: Diagnosis not present

## 2018-02-21 DIAGNOSIS — Z510 Encounter for antineoplastic radiation therapy: Secondary | ICD-10-CM | POA: Diagnosis not present

## 2018-02-22 ENCOUNTER — Ambulatory Visit
Admission: RE | Admit: 2018-02-22 | Discharge: 2018-02-22 | Disposition: A | Discharge status: Home / Self Care | Payer: BLUE CROSS/BLUE SHIELD | Source: Ambulatory Visit | Attending: Radiation Oncology | Admitting: Radiation Oncology

## 2018-02-22 DIAGNOSIS — Z170 Estrogen receptor positive status [ER+]: Secondary | ICD-10-CM | POA: Diagnosis not present

## 2018-02-22 DIAGNOSIS — C50211 Malignant neoplasm of upper-inner quadrant of right female breast: Secondary | ICD-10-CM | POA: Diagnosis not present

## 2018-02-22 DIAGNOSIS — Z510 Encounter for antineoplastic radiation therapy: Secondary | ICD-10-CM | POA: Diagnosis not present

## 2018-02-23 ENCOUNTER — Ambulatory Visit
Admission: RE | Admit: 2018-02-23 | Discharge: 2018-02-23 | Disposition: A | Discharge status: Home / Self Care | Payer: BLUE CROSS/BLUE SHIELD | Source: Ambulatory Visit | Attending: Radiation Oncology | Admitting: Radiation Oncology

## 2018-02-23 DIAGNOSIS — Z170 Estrogen receptor positive status [ER+]: Secondary | ICD-10-CM | POA: Diagnosis not present

## 2018-02-23 DIAGNOSIS — Z510 Encounter for antineoplastic radiation therapy: Secondary | ICD-10-CM | POA: Diagnosis not present

## 2018-02-23 DIAGNOSIS — C50211 Malignant neoplasm of upper-inner quadrant of right female breast: Secondary | ICD-10-CM | POA: Diagnosis not present

## 2018-02-26 ENCOUNTER — Ambulatory Visit
Admission: RE | Admit: 2018-02-26 | Discharge: 2018-02-26 | Disposition: A | Discharge status: Home / Self Care | Payer: BLUE CROSS/BLUE SHIELD | Source: Ambulatory Visit | Attending: Radiation Oncology | Admitting: Radiation Oncology

## 2018-02-26 DIAGNOSIS — C50211 Malignant neoplasm of upper-inner quadrant of right female breast: Secondary | ICD-10-CM | POA: Diagnosis not present

## 2018-02-26 DIAGNOSIS — Z510 Encounter for antineoplastic radiation therapy: Secondary | ICD-10-CM | POA: Diagnosis not present

## 2018-02-26 DIAGNOSIS — Z170 Estrogen receptor positive status [ER+]: Secondary | ICD-10-CM | POA: Diagnosis not present

## 2018-02-27 ENCOUNTER — Ambulatory Visit
Admission: RE | Admit: 2018-02-27 | Discharge: 2018-02-27 | Disposition: A | Discharge status: Home / Self Care | Payer: BLUE CROSS/BLUE SHIELD | Source: Ambulatory Visit | Attending: Radiation Oncology | Admitting: Radiation Oncology

## 2018-02-27 DIAGNOSIS — C50211 Malignant neoplasm of upper-inner quadrant of right female breast: Secondary | ICD-10-CM | POA: Diagnosis not present

## 2018-02-27 DIAGNOSIS — Z510 Encounter for antineoplastic radiation therapy: Secondary | ICD-10-CM | POA: Diagnosis not present

## 2018-02-27 DIAGNOSIS — Z170 Estrogen receptor positive status [ER+]: Secondary | ICD-10-CM | POA: Diagnosis not present

## 2018-02-28 ENCOUNTER — Ambulatory Visit
Admission: RE | Admit: 2018-02-28 | Discharge: 2018-02-28 | Disposition: A | Discharge status: Home / Self Care | Payer: BLUE CROSS/BLUE SHIELD | Source: Ambulatory Visit | Attending: Radiation Oncology | Admitting: Radiation Oncology

## 2018-02-28 DIAGNOSIS — C50211 Malignant neoplasm of upper-inner quadrant of right female breast: Secondary | ICD-10-CM | POA: Diagnosis not present

## 2018-02-28 DIAGNOSIS — Z170 Estrogen receptor positive status [ER+]: Secondary | ICD-10-CM | POA: Diagnosis not present

## 2018-02-28 DIAGNOSIS — Z510 Encounter for antineoplastic radiation therapy: Secondary | ICD-10-CM | POA: Diagnosis not present

## 2018-03-01 ENCOUNTER — Ambulatory Visit
Admission: RE | Admit: 2018-03-01 | Discharge: 2018-03-01 | Disposition: A | Discharge status: Home / Self Care | Payer: BLUE CROSS/BLUE SHIELD | Source: Ambulatory Visit | Attending: Radiation Oncology | Admitting: Radiation Oncology

## 2018-03-01 DIAGNOSIS — Z170 Estrogen receptor positive status [ER+]: Secondary | ICD-10-CM | POA: Diagnosis not present

## 2018-03-01 DIAGNOSIS — C50211 Malignant neoplasm of upper-inner quadrant of right female breast: Secondary | ICD-10-CM | POA: Diagnosis not present

## 2018-03-01 DIAGNOSIS — Z510 Encounter for antineoplastic radiation therapy: Secondary | ICD-10-CM | POA: Diagnosis not present

## 2018-03-02 ENCOUNTER — Ambulatory Visit
Admission: RE | Admit: 2018-03-02 | Discharge: 2018-03-02 | Disposition: A | Discharge status: Home / Self Care | Payer: BLUE CROSS/BLUE SHIELD | Source: Ambulatory Visit | Attending: Radiation Oncology | Admitting: Radiation Oncology

## 2018-03-02 DIAGNOSIS — C50211 Malignant neoplasm of upper-inner quadrant of right female breast: Secondary | ICD-10-CM | POA: Diagnosis not present

## 2018-03-02 DIAGNOSIS — Z510 Encounter for antineoplastic radiation therapy: Secondary | ICD-10-CM | POA: Diagnosis not present

## 2018-03-02 DIAGNOSIS — Z170 Estrogen receptor positive status [ER+]: Secondary | ICD-10-CM | POA: Diagnosis not present

## 2018-03-05 ENCOUNTER — Ambulatory Visit
Admission: RE | Admit: 2018-03-05 | Discharge: 2018-03-05 | Disposition: A | Discharge status: Home / Self Care | Payer: BLUE CROSS/BLUE SHIELD | Source: Ambulatory Visit | Attending: Radiation Oncology | Admitting: Radiation Oncology

## 2018-03-05 DIAGNOSIS — Z170 Estrogen receptor positive status [ER+]: Secondary | ICD-10-CM | POA: Diagnosis not present

## 2018-03-05 DIAGNOSIS — C50211 Malignant neoplasm of upper-inner quadrant of right female breast: Secondary | ICD-10-CM | POA: Diagnosis not present

## 2018-03-05 DIAGNOSIS — Z510 Encounter for antineoplastic radiation therapy: Secondary | ICD-10-CM | POA: Diagnosis not present

## 2018-03-06 ENCOUNTER — Ambulatory Visit
Admission: RE | Admit: 2018-03-06 | Discharge: 2018-03-06 | Disposition: A | Discharge status: Home / Self Care | Payer: BLUE CROSS/BLUE SHIELD | Source: Ambulatory Visit | Attending: Radiation Oncology | Admitting: Radiation Oncology

## 2018-03-06 DIAGNOSIS — Z170 Estrogen receptor positive status [ER+]: Secondary | ICD-10-CM | POA: Diagnosis not present

## 2018-03-06 DIAGNOSIS — C50211 Malignant neoplasm of upper-inner quadrant of right female breast: Secondary | ICD-10-CM | POA: Diagnosis not present

## 2018-03-06 DIAGNOSIS — Z510 Encounter for antineoplastic radiation therapy: Secondary | ICD-10-CM | POA: Diagnosis not present

## 2018-03-07 ENCOUNTER — Ambulatory Visit
Admission: RE | Admit: 2018-03-07 | Discharge: 2018-03-07 | Disposition: A | Discharge status: Home / Self Care | Payer: BLUE CROSS/BLUE SHIELD | Source: Ambulatory Visit | Attending: Radiation Oncology | Admitting: Radiation Oncology

## 2018-03-07 DIAGNOSIS — Z170 Estrogen receptor positive status [ER+]: Secondary | ICD-10-CM | POA: Diagnosis not present

## 2018-03-07 DIAGNOSIS — Z510 Encounter for antineoplastic radiation therapy: Secondary | ICD-10-CM | POA: Diagnosis not present

## 2018-03-07 DIAGNOSIS — C50211 Malignant neoplasm of upper-inner quadrant of right female breast: Secondary | ICD-10-CM | POA: Diagnosis not present

## 2018-03-08 ENCOUNTER — Ambulatory Visit
Admission: RE | Admit: 2018-03-08 | Discharge: 2018-03-08 | Disposition: A | Discharge status: Home / Self Care | Payer: BLUE CROSS/BLUE SHIELD | Source: Ambulatory Visit | Attending: Radiation Oncology | Admitting: Radiation Oncology

## 2018-03-08 DIAGNOSIS — Z170 Estrogen receptor positive status [ER+]: Secondary | ICD-10-CM | POA: Diagnosis not present

## 2018-03-08 DIAGNOSIS — C50211 Malignant neoplasm of upper-inner quadrant of right female breast: Secondary | ICD-10-CM | POA: Diagnosis not present

## 2018-03-08 DIAGNOSIS — Z510 Encounter for antineoplastic radiation therapy: Secondary | ICD-10-CM | POA: Diagnosis not present

## 2018-03-09 ENCOUNTER — Ambulatory Visit: Payer: BLUE CROSS/BLUE SHIELD | Admitting: Radiation Oncology

## 2018-03-09 ENCOUNTER — Ambulatory Visit: Payer: BLUE CROSS/BLUE SHIELD

## 2018-03-09 ENCOUNTER — Ambulatory Visit
Admission: RE | Admit: 2018-03-09 | Discharge: 2018-03-09 | Disposition: A | Discharge status: Home / Self Care | Payer: BLUE CROSS/BLUE SHIELD | Source: Ambulatory Visit | Attending: Radiation Oncology | Admitting: Radiation Oncology

## 2018-03-09 DIAGNOSIS — Z510 Encounter for antineoplastic radiation therapy: Secondary | ICD-10-CM | POA: Diagnosis not present

## 2018-03-09 DIAGNOSIS — Z170 Estrogen receptor positive status [ER+]: Secondary | ICD-10-CM | POA: Diagnosis not present

## 2018-03-09 DIAGNOSIS — C50211 Malignant neoplasm of upper-inner quadrant of right female breast: Secondary | ICD-10-CM | POA: Diagnosis not present

## 2018-03-12 ENCOUNTER — Ambulatory Visit: Payer: BLUE CROSS/BLUE SHIELD

## 2018-03-13 ENCOUNTER — Ambulatory Visit
Admission: RE | Admit: 2018-03-13 | Discharge: 2018-03-13 | Disposition: A | Discharge status: Home / Self Care | Payer: BLUE CROSS/BLUE SHIELD | Source: Ambulatory Visit | Attending: Radiation Oncology | Admitting: Radiation Oncology

## 2018-03-13 ENCOUNTER — Ambulatory Visit: Payer: BLUE CROSS/BLUE SHIELD

## 2018-03-13 DIAGNOSIS — Z510 Encounter for antineoplastic radiation therapy: Secondary | ICD-10-CM | POA: Diagnosis not present

## 2018-03-13 DIAGNOSIS — C50211 Malignant neoplasm of upper-inner quadrant of right female breast: Secondary | ICD-10-CM | POA: Diagnosis not present

## 2018-03-13 DIAGNOSIS — Z170 Estrogen receptor positive status [ER+]: Secondary | ICD-10-CM | POA: Diagnosis not present

## 2018-03-14 ENCOUNTER — Ambulatory Visit
Admission: RE | Admit: 2018-03-14 | Discharge: 2018-03-14 | Disposition: A | Discharge status: Home / Self Care | Payer: BLUE CROSS/BLUE SHIELD | Source: Ambulatory Visit | Attending: Radiation Oncology | Admitting: Radiation Oncology

## 2018-03-14 DIAGNOSIS — C50211 Malignant neoplasm of upper-inner quadrant of right female breast: Secondary | ICD-10-CM | POA: Diagnosis not present

## 2018-03-14 DIAGNOSIS — Z510 Encounter for antineoplastic radiation therapy: Secondary | ICD-10-CM | POA: Diagnosis not present

## 2018-03-14 DIAGNOSIS — Z170 Estrogen receptor positive status [ER+]: Secondary | ICD-10-CM | POA: Diagnosis not present

## 2018-03-15 ENCOUNTER — Encounter: Payer: Self-pay | Admitting: Radiation Oncology

## 2018-03-15 ENCOUNTER — Ambulatory Visit: Payer: BLUE CROSS/BLUE SHIELD

## 2018-03-15 ENCOUNTER — Ambulatory Visit
Admission: RE | Admit: 2018-03-15 | Discharge: 2018-03-15 | Disposition: A | Discharge status: Home / Self Care | Payer: BLUE CROSS/BLUE SHIELD | Source: Ambulatory Visit | Attending: Radiation Oncology | Admitting: Radiation Oncology

## 2018-03-15 DIAGNOSIS — Z170 Estrogen receptor positive status [ER+]: Secondary | ICD-10-CM | POA: Diagnosis not present

## 2018-03-15 DIAGNOSIS — C50211 Malignant neoplasm of upper-inner quadrant of right female breast: Secondary | ICD-10-CM | POA: Diagnosis not present

## 2018-03-15 DIAGNOSIS — Z510 Encounter for antineoplastic radiation therapy: Secondary | ICD-10-CM | POA: Diagnosis not present

## 2018-03-16 ENCOUNTER — Ambulatory Visit: Payer: BLUE CROSS/BLUE SHIELD

## 2018-03-19 NOTE — Progress Notes (Signed)
  Radiation Oncology         [phone]) [phone] ________________________________  Name: Romanita Fager MRN: [id number]  Date: 03/15/2018  DOB: 04/17/1956  End of Treatment Note  Diagnosis:   62 y.o. female with Stage IA, pT1bN0M0, grade 1, ER/PR positive invasive lobular carcinoma with LCIS of the right breast     Indication for treatment:  Curative       Radiation treatment dates:   02/15/2018 - 03/15/2018  Site/dose:   1. Right Breast / 42.56 Gy in 16 fractions 2. Boost / 8 Gy in 4 fractions Total dose of 50.56 Gy.  Beams/energy:   1. 3D / 6X  2. 3D / 9E  Narrative: The patient tolerated radiation treatment very well.  She has mild to moderate erythema in the treatment area and continues to use Radiaplex daily. No other skin issues or complaints of fatigue or pain.  Plan: The patient has completed radiation treatment. The patient will return to radiation oncology clinic for routine followup in one month. I advised them to call or return sooner if they have any questions or concerns related to their recovery or treatment.  ------------------------------------------------  Jodelle Gross, MD, PhD  This document serves as a record of services personally performed by Kyung Rudd, MD. It was created on his behalf by Rae Lips, a trained medical scribe. The creation of this record is based on the scribe's personal observations and the provider's statements to them. This document has been checked and approved by the attending provider.

## 2018-04-10 NOTE — Progress Notes (Signed)
[place]  [phone] [phone]     ID: Lisa Valenzuela DOB: 1956/01/09  MR#: [id number]  GOT#:[id number]  Patient Care Team: Fanny Bien, MD as PCP - General (Family Medicine) Antwon Rochin, Virgie Dad, MD as Consulting Physician (Oncology) Alphonsa Overall, MD as Consulting Physician (General Surgery) Kyung Rudd, MD as Consulting Physician (Radiation Oncology) OTHER MD:  CHIEF COMPLAINT: Estrogen receptor positive lobular breast cancer  CURRENT TREATMENT: Tamoxifen   HISTORY OF CURRENT ILLNESS: From the original intake note:  Lisa Valenzuela had routine screening mammography on 11/14/2017 showing a possible abnormality in her bilateral breasts. She underwent a bilateral diagnostic mammography with tomography and bilateral breast ultrasonography at [facility] on 11/17/2017 showing: breast density category C.  The left breast mass was evaluated and proved to be a benign cyst.  On the right however there was an area of architectural distortion, which could not be well delineated.  It was negative on ultrasonography.  The right axilla also was benign on ultrasound.  Accordingly on 11/21/2017 the patient proceeded to biopsy of the right breast area in question. 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 E-cadherin negative. The base of tumor is HER-2 negative and the proliferation marker Ki67 is 2%.   The patient's subsequent history is as detailed below.  INTERVAL HISTORY: Lisa Valenzuela returns today for follow-up and is accompanied by her husband.   On 01/08/2018 she underwent left lumpectomy lumpectomies. This showed intraductal papilloma.  She also underwent a right breast lumpectomy showed invasive lobular carcinoma, grade 1, spanning 0.6 cm and is 0.2-0.3 cm of the posterior  margin focally. Estrogen Receptor positive, 95%, Progesterone Receptor positive, 30%, HER2 negative and Ki-67: 2%. Biopsies obtained from the right axilla resulted negative.   She then proceeded to adjuvant radiation which she completed 03/15/2018.  She generally did well with this, without significant problems with peeling or fatigue.    She is now ready to start antiestrogens.  REVIEW OF SYSTEMS: She has been busy working. During radiation she walked, but hasn't been walking as much lately. The patient denies unusual headaches, visual changes, nausea, vomiting, or dizziness. There has been no unusual cough, phlegm production, or pleurisy. This been no change in bowel or bladder habits. The patient denies unexplained fatigue or unexplained weight loss, bleeding, rash, or fever. A detailed review of systems was otherwise noncontributory.    PAST MEDICAL HISTORY: Past Medical History:  Diagnosis Date  . Breast cancer ([place])   . Family history of breast cancer   . Genetic testing 12/25/2017   Breast/GYN panel (23 genes) @ [facility] - No pathogenic mutations detected  . Vitamin D deficiency     PAST SURGICAL HISTORY: Past Surgical History:  Procedure Laterality Date  . BREAST LUMPECTOMY WITH RADIOACTIVE SEED AND SENTINEL LYMPH NODE BIOPSY Bilateral 01/08/2018   Procedure: BILATERAL BREAST LUMPECTOMY WITH BILATERAL  RADIOACTIVE SEEDS AND RIGHT  SENTINEL LYMPH NODE BIOPSY;  Surgeon: Alphonsa Overall, MD;  Location: [facility];  Service: General;  Laterality: Bilateral;  . BREAST SURGERY     bilateral breast bx    FAMILY HISTORY Family History  Problem Relation Age of Onset  . Breast cancer Mother 28       currently 6  . Prostate cancer Father 2       currently 82  . Breast cancer Sister 74  currently 56  . Colon cancer Paternal Uncle        dx 25s; deceased 88s  . Bladder Cancer Maternal Grandmother        dx 70s; deceased 75s  The patient's father, 53, and mother, 32 are still alive.  Her father was diagnosed with prostate cancer at 38 years old. Her mother was diagnosed with breast cancer at 62 years old. The patient has two brothers and two sisters. One of her sisters was diagnoses with breast cancer at 62 years old.   GYNECOLOGIC HISTORY:  No LMP recorded. Patient is postmenopausal. Menarche: 62 years old Age at first live birth: 62 years old [place] P 2 LMP 2009 Contraceptive yes, 1980-2000 HRT no  Hysterectomy? no SO?no  SOCIAL HISTORY:  Airika works as an Counselling psychologist. Her husband, Kasandra Knudsen is a Theme park manager at [employer].  They have one daughter, [name], 89, who lives in [place], [place], and is a middle school Associate Professor. They also have one son, [name], 81, who lives in [place], [place], and is a Physicist, medical. The patient has two grandchildren.     ADVANCED DIRECTIVES:    HEALTH MAINTENANCE: Social History   Tobacco Use  . Smoking status: Never Smoker  . Smokeless tobacco: Never Used  Substance Use Topics  . Alcohol use: No    Frequency: Never  . Drug use: No     Colonoscopy:  PAP:  Bone density:   No Known Allergies  Current Outpatient Medications  Medication Sig Dispense Refill  . Biotin w/ Vitamins C & E (HAIR/SKIN/NAILS PO) Take 1 tablet by mouth daily.    Marland Kitchen BLACK COHOSH PO Take 1 capsule by mouth daily.    . Cholecalciferol (VITAMIN D3 PO) Take 1 tablet by mouth daily.    Marland Kitchen HYDROcodone-acetaminophen (NORCO/VICODIN) 5-325 MG tablet Take 1 tablet by mouth every 6 (six) hours as needed for moderate pain. (Patient not taking: Reported on 02/01/2018) 20 tablet 0  . loratadine (CLARITIN) 10 MG tablet Take 10 mg by mouth daily.    . minocycline (DYNACIN) 50 MG tablet Take 50 mg by mouth daily as needed. For rosacea  1  . Multiple Vitamin (MULTIVITAMIN WITH MINERALS) TABS tablet Take 1 tablet by mouth daily. Centrum Women's 50+     No current facility-administered medications for this visit.     OBJECTIVE: Middle-aged  white woman in no acute distress  Vitals:   04/12/18 1001  BP: 117/71  Pulse: 77  Resp: 18  Temp: 97.9 F (36.6 C)  SpO2: 99%     Body mass index is 26.78 kg/m.   Wt Readings from Last 3 Encounters:  04/12/18 146 lb 6.4 oz (66.4 kg)  02/01/18 143 lb 3.2 oz (65 kg)  01/08/18 143 lb (64.9 kg)      ECOG FS:1 - Symptomatic but completely ambulatory  Sclerae unicteric, EOMs intact Oropharynx clear and moist No cervical or supraclavicular adenopathy Lungs no rales or rhonchi Heart regular rate and rhythm Abd soft, nontender, positive bowel sounds MSK no focal spinal tenderness, no upper extremity lymphedema Neuro: nonfocal, well oriented, appropriate affect Breasts: Status post bilateral lumpectomies.  The cosmetic result is excellent.  There is no palpable mass.  Both axillae are benign.  LAB RESULTS:  CMP     Component Value Date/Time   NA 141 01/01/2018 0843   K 3.8 01/01/2018 0843   CL 105 01/01/2018 0843   CO2 26 01/01/2018 0843   GLUCOSE 96 01/01/2018 0843   BUN  16 01/01/2018 0843   CREATININE 0.79 01/01/2018 0843   CREATININE 0.99 11/29/2017 0834   CALCIUM 9.6 01/01/2018 0843   PROT 7.9 11/29/2017 0834   ALBUMIN 4.2 11/29/2017 0834   AST 22 11/29/2017 0834   ALT 20 11/29/2017 0834   ALKPHOS 90 11/29/2017 0834   BILITOT 0.5 11/29/2017 0834   GFRNONAA >60 01/01/2018 0843   GFRNONAA 60 (L) 11/29/2017 0834   GFRAA >60 01/01/2018 0843   GFRAA >60 11/29/2017 0834    No results found for: TOTALPROTELP, ALBUMINELP, A1GS, A2GS, BETS, BETA2SER, GAMS, MSPIKE, SPEI  No results found for: KPAFRELGTCHN, LAMBDASER, [facility]  Lab Results  Component Value Date   WBC 7.3 01/01/2018   NEUTROABS 4.3 11/29/2017   HGB 13.8 01/01/2018   HCT 41.1 01/01/2018   MCV 92.6 01/01/2018   PLT 243 01/01/2018    '@LASTCHEMISTRY' @  No results found for: LABCA2  No components found for: XBJYNW295  No results for input(s): INR in the last 168 hours.  No results found for:  LABCA2  No results found for: AOZ308  No results found for: MVH846  No results found for: NGE952  No results found for: CA2729  No components found for: HGQUANT  No results found for: CEA1 / No results found for: CEA1   No results found for: AFPTUMOR  No results found for: CHROMOGRNA  No results found for: PSA1  No visits with results within 3 Day(s) from this visit.  Latest known visit with results is:  Hospital Outpatient Visit on 01/01/2018  Component Date Value Ref Range Status  . WBC 01/01/2018 7.3  4.0 - 10.5 K/uL Final  . RBC 01/01/2018 4.44  3.87 - 5.11 MIL/uL Final  . Hemoglobin 01/01/2018 13.8  12.0 - 15.0 g/dL Final  . HCT 01/01/2018 41.1  36.0 - 46.0 % Final  . MCV 01/01/2018 92.6  78.0 - 100.0 fL Final  . MCH 01/01/2018 31.1  26.0 - 34.0 pg Final  . MCHC 01/01/2018 33.6  30.0 - 36.0 g/dL Final  . RDW 01/01/2018 13.2  11.5 - 15.5 % Final  . Platelets 01/01/2018 243  150 - 400 K/uL Final   Performed at [facility], [place] [address] [place], [place] [address]  . Sodium 01/01/2018 141  135 - 145 mmol/L Final  . Potassium 01/01/2018 3.8  3.5 - 5.1 mmol/L Final  . Chloride 01/01/2018 105  101 - 111 mmol/L Final  . CO2 01/01/2018 26  22 - 32 mmol/L Final  . Glucose, Bld 01/01/2018 96  65 - 99 mg/dL Final  . BUN 01/01/2018 16  6 - 20 mg/dL Final  . Creatinine, Ser 01/01/2018 0.79  0.44 - 1.00 mg/dL Final  . Calcium 01/01/2018 9.6  8.9 - 10.3 mg/dL Final  . GFR calc non Af Amer 01/01/2018 >60  >60 mL/min Final  . GFR calc Af Amer 01/01/2018 >60  >60 mL/min Final   Comment: (NOTE) The eGFR has been calculated using the CKD EPI equation. This calculation has not been validated in all clinical situations. eGFR's persistently <60 mL/min signify possible Chronic Kidney Disease.   . Anion gap 01/01/2018 10  5 - 15 Final   Performed at [facility], [place] [address], [place], [place] [address]    (this displays the last labs from the last 3 days)  No  results found for: TOTALPROTELP, ALBUMINELP, A1GS, A2GS, BETS, BETA2SER, GAMS, MSPIKE, SPEI (this displays SPEP labs)  No results found for: KPAFRELGTCHN, LAMBDASER, KAPLAMBRATIO (kappa/lambda light  chains)  No results found for: HGBA, HGBA2QUANT, HGBFQUANT, HGBSQUAN (Hemoglobinopathy evaluation)   No results found for: LDH  No results found for: IRON, TIBC, IRONPCTSAT (Iron and TIBC)  No results found for: FERRITIN  Urinalysis No results found for: COLORURINE, APPEARANCEUR, LABSPEC, PHURINE, GLUCOSEU, HGBUR, BILIRUBINUR, KETONESUR, PROTEINUR, UROBILINOGEN, NITRITE, LEUKOCYTESUR   STUDIES: No results found.  ELIGIBLE FOR AVAILABLE RESEARCH PROTOCOL:no  ASSESSMENT: 62 y.o. [place], [place] woman status post right breast upper inner quadrant biopsy 11/21/2017 for a clinical TXN0 invasive lobular carcinoma, grade 2, estrogen and progesterone receptor positive, HER-2 not amplified, with an MIB-1 of 2%.  (a) right breast upper outer quadrant biopsy 12/18/2017 showed no malignancy  (b) left breast retroareolar biopsy 12/18/2017 showed an atypical papillary lesion  (c) left lumpectomy 01/08/2018 showed an intraductal papilloma with no malignancy  (1) right lumpectomy and sentinel lymph node sampling 01/08/2018 showed a pT1b pN0, stage IA invasive lobular carcinoma, grade 1, with negative margins  (a) 2 right axillary lymph nodes were removed  (2) adjuvant radiation 02/15/2018 - 03/15/2018 Site/dose:   1. Right Breast / 42.56 Gy in 16 fractions 2. Boost / 8 Gy in 4 fractions Total dose of 50.56 Gy.  (4) tamoxifen started 04/12/2018  (5) genetics testing through the breast GYN panel at in [place] found no pathogenic mutations  PLAN: Lisa Valenzuela did very well with local therapy, which she has completed.  She is now ready to start systemic therapy.  We are not doing chemotherapy because her risk of systemic disease is low and the chance of benefit is practically nil given that we are  dealing with a low-grade slow-growing tumor  Nevertheless her risk of systemic recurrence is in the 18% range in the absence of any systemic therapy  Any antiestrogen for 5 years will cut that risk in about half.  We discussed the difference between tamoxifen and anastrozole in detail. She understands that anastrozole and the aromatase inhibitors in general work by blocking estrogen production. Accordingly vaginal dryness, decrease in bone density, and of course hot flashes can result. The aromatase inhibitors can also negatively affect the cholesterol profile, although that is a minor effect. One out of 5 women on aromatase inhibitors we will feel "old and achy". This arthralgia/myalgia syndrome, which resembles fibromyalgia clinically, does resolve with stopping the medications. Accordingly this is not a reason to not try an aromatase inhibitor but it is a frequent reason to stop it (in other words 20% of women will not be able to tolerate these medications).  Tamoxifen on the other hand does not block estrogen production. It does not "take away a woman's estrogen". It blocks the estrogen receptor in breast cells. Like anastrozole, it can also cause hot flashes. As opposed to anastrozole, tamoxifen has many estrogen-like effects. It is technically an estrogen receptor modulator. This means that in some tissues tamoxifen works like estrogen-- for example it helps strengthen the bones. It tends to improve the cholesterol profile. It can cause thickening of the endometrial lining, and even endometrial polyps or rarely cancer of the uterus.(The risk of uterine cancer due to tamoxifen is one additional cancer per thousand women year). It can cause vaginal wetness or stickiness. It can cause blood clots through this estrogen-like effect--the risk of blood clots with tamoxifen is exactly the same as with birth control pills or hormone replacement.  Neither of these agents causes mood changes or weight gain,  despite the popular belief that they can have these side effects. We have data  from studies comparing either of these drugs with placebo, and in those cases the control group had the same amount of weight gain and depression as the group that took the drug.  After this discussion she was pretty clear that she would prefer tamoxifen and I have placed that prescription.  She will see me again in October.  If she is tolerating tamoxifen well the plan will be to do that for 5 years.  He will be helpful to have a bone density prior to that visit and that has been ordered  She knows to call for any other issues that may develop before the next visit.    Amnah Breuer, Virgie Dad, MD  04/12/18 10:34 AM Medical Oncology and Hematology [facility] [address] [place], [place] [address] Tel. [phone]    Fax. [phone]  Carmela Hurt am acting as a scribe for Chauncey Cruel, MD.   See med

## 2018-04-12 ENCOUNTER — Telehealth: Payer: Self-pay | Admitting: Oncology

## 2018-04-12 ENCOUNTER — Inpatient Hospital Stay: Payer: BLUE CROSS/BLUE SHIELD | Attending: Oncology | Admitting: Oncology

## 2018-04-12 VITALS — BP 117/71 | HR 77 | Temp 97.9°F | Resp 18 | Ht 62.0 in | Wt 146.4 lb

## 2018-04-12 DIAGNOSIS — C50211 Malignant neoplasm of upper-inner quadrant of right female breast: Secondary | ICD-10-CM

## 2018-04-12 DIAGNOSIS — Z170 Estrogen receptor positive status [ER+]: Secondary | ICD-10-CM

## 2018-04-12 DIAGNOSIS — Z803 Family history of malignant neoplasm of breast: Secondary | ICD-10-CM

## 2018-04-12 MED ORDER — TAMOXIFEN CITRATE 20 MG PO TABS
20.0000 mg | ORAL_TABLET | Freq: Every day | ORAL | 12 refills | Status: AC
Start: 2018-04-12 — End: 2018-05-12

## 2018-04-12 NOTE — Telephone Encounter (Signed)
Gave avs and calendar ° °

## 2018-04-20 ENCOUNTER — Telehealth: Payer: Self-pay | Admitting: *Deleted

## 2018-04-20 NOTE — Telephone Encounter (Signed)
Faxed ROI to [employer]; release [phone]

## 2018-04-24 ENCOUNTER — Telehealth: Payer: Self-pay | Admitting: *Deleted

## 2018-04-24 ENCOUNTER — Inpatient Hospital Stay: Payer: BLUE CROSS/BLUE SHIELD | Attending: Oncology | Admitting: Medical

## 2018-04-24 VITALS — BP 135/77 | HR 79 | Temp 98.5°F | Resp 18 | Ht 62.0 in | Wt 146.7 lb

## 2018-04-24 DIAGNOSIS — Z170 Estrogen receptor positive status [ER+]: Secondary | ICD-10-CM | POA: Diagnosis not present

## 2018-04-24 DIAGNOSIS — Z803 Family history of malignant neoplasm of breast: Secondary | ICD-10-CM | POA: Diagnosis not present

## 2018-04-24 DIAGNOSIS — R21 Rash and other nonspecific skin eruption: Secondary | ICD-10-CM | POA: Insufficient documentation

## 2018-04-24 DIAGNOSIS — C50211 Malignant neoplasm of upper-inner quadrant of right female breast: Secondary | ICD-10-CM | POA: Insufficient documentation

## 2018-04-24 MED ORDER — TRIAMCINOLONE ACETONIDE 0.1 % EX LOTN: 1.0000 "application " | TOPICAL_LOTION | Freq: Three times a day (TID) | CUTANEOUS | 1 refills | Status: DC

## 2018-04-24 NOTE — Progress Notes (Signed)
Pt presents with itchy, red, spotty rash on bilat arms & legs.  Denies CP/SOB/trouble swallowing.  Afebrile.  Went camping this past weekend and reports sun exposure.  Taking Tamoxifen daily since June.

## 2018-04-24 NOTE — Patient Instructions (Signed)

## 2018-04-24 NOTE — Telephone Encounter (Signed)
Pt called requesting a call back from nurse.  Spoke with pt and was informed that pt started Tamoxifen on 6/29.   Stated over the weekend, pt was at the [place].  Stated she developed red rashes , hives - very itching - mostly on arms and legs since came back from the trip.  Pt has been applying Hydrocortisone cream to help with itching.  Pt wanted to know if these symptoms could be related to Tamoxifen or sun exposure.    Instructed pt to come in now for [facility] evaluation.  Pt voiced understanding, and en route.

## 2018-04-26 NOTE — Progress Notes (Signed)
Sympt[facility] Management Clinic Progress Note   Lisa Valenzuela [id number] 12/26/1955 62 y.o.  Lisa Valenzuela is managed by Dr. Jana Hakim  Actively treated with chemotherapy/immunotherapy: no  Current Therapy: Tamoxifen    Assessment: Plan:    Rash - Plan: triamcinolone lotion (KENALOG) 0.1 %  Malignant neoplasm of upper-inner quadrant of right breast in female, estrogen receptor positive (HCC)   Rash of the legs and arms: The patient was given a prescription for triamcinolone lotion.  ER positive malignant neoplasm of the upper inner quadrant of the right breast: The patient continues on tamoxifen under the direction of Dr. Jana Hakim.  She is scheduled to follow-up for a visit in the survivorship clinic on 07/13/2018.  Please see After Visit Summary for patient specific instructions.  Future Appointments  Date Time Provider [place] [place]  07/02/2018 11:00 AM [facility] DX DEXA 1 [facility] GI-BREAST CE  07/13/2018 10:00 AM Causey, Charlestine Massed, NP [facility] None  07/23/2018 10:00 AM [facility] LAB 1 [facility] None  07/23/2018 10:30 AM Magrinat, Virgie Dad, MD [facility] None    No orders of the defined types were placed in this encounter.      Subjective:   Patient ID:  Lisa Valenzuela is a 62 y.o. (DOB 11/26/1955) female.  Chief Complaint:  Chief Complaint  Patient presents with  . Rash    HPI Lisa Valenzuela is a 63 year old female with a diagnosis of an ER positive lobular breast cancer who is managed by Dr. Jana Hakim.  The patient is currently on tamoxifen which she began on June 29.  She presents to the office today with a diffuse pruritic and erythematous rash over her arms and legs.  She recently returned from a camping trip.  She was using sunscreen and bug spray.  She believes that this could have contributed to the rash.  She has been using hydrocortisone cream which has helped minimally.  She denies fevers, chills, shortness of breath, difficulty swallowing, chest or throat  tightness.  Medications: I have reviewed the patient's current medications.  Allergies: No Known Allergies  Past Medical History:  Diagnosis Date  . Breast cancer ([place])   . Family history of breast cancer   . Genetic testing 12/25/2017   Breast/GYN panel (23 genes) @ [facility] - No pathogenic mutations detected  . Vitamin D deficiency     Past Surgical History:  Procedure Laterality Date  . BREAST LUMPECTOMY WITH RADIOACTIVE SEED AND SENTINEL LYMPH NODE BIOPSY Bilateral 01/08/2018   Procedure: BILATERAL BREAST LUMPECTOMY WITH BILATERAL  RADIOACTIVE SEEDS AND RIGHT  SENTINEL LYMPH NODE BIOPSY;  Surgeon: Alphonsa Overall, MD;  Location: [place];  Service: General;  Laterality: Bilateral;  . BREAST SURGERY     bilateral breast bx    Family History  Problem Relation Age of Onset  . Breast cancer Mother 81       currently 30  . Prostate cancer Father 106       currently 66  . Breast cancer Sister 58       currently 70  . Colon cancer Paternal Uncle        dx 38s; deceased 72s  . Bladder Cancer Maternal Grandmother        dx 3s; deceased 85s    Social History   Socioeconomic History  . Marital status: Married    Spouse name: Not on file  . Number of children: Not on file  . Years of education: Not on file  . Highest education level: Not on file  Occupational History  .  Not on file  Social Needs  . Financial resource strain: Not on file  . Food insecurity:    Worry: Not on file    Inability: Not on file  . Transportation needs:    Medical: Not on file    Non-medical: Not on file  Tobacco Use  . Smoking status: Never Smoker  . Smokeless tobacco: Never Used  Substance and Sexual Activity  . Alcohol use: No    Frequency: Never  . Drug use: No  . Sexual activity: Yes  Lifestyle  . Physical activity:    Days per week: Not on file    Minutes per session: Not on file  . Stress: Not on file  Relationships  . Social connections:    Talks on phone: Not on file    Gets  together: Not on file    Attends religious service: Not on file    Active member of club or organization: Not on file    Attends meetings of clubs or organizations: Not on file    Relationship status: Not on file  . Intimate partner violence:    Fear of current or ex partner: Not on file    Emotionally abused: Not on file    Physically abused: Not on file    Forced sexual activity: Not on file  Other Topics Concern  . Not on file  Social History Narrative  . Not on file    Past Medical History, Surgical history, Social history, and Family history were reviewed and updated as appropriate.   Please see review of systems for further details on the patient's review from today.   Review of Systems:  Review of Systems  Constitutional: Negative for chills, diaphoresis and fever.  HENT: Negative for trouble swallowing.   Respiratory: Negative for cough, chest tightness and shortness of breath.   Cardiovascular: Negative for chest pain and palpitations.  Skin: Positive for rash.    Objective:   Physical Exam:  BP 135/77 (BP Location: Left Arm, Patient Position: Sitting)   Pulse 79   Temp 98.5 F (36.9 C) (Oral)   Resp 18   Ht 5\' 2"  (1.575 m)   Wt 146 lb 11.2 oz (66.5 kg)   SpO2 100%   BMI 26.83 kg/m  ECOG: 0  Physical Exam  Constitutional: No distress.  HENT:  Head: Normocephalic and atraumatic.  Mouth/Throat: Oropharynx is clear and moist. No oropharyngeal exudate.  Cardiovascular: Normal rate, regular rhythm and normal heart sounds. Exam reveals no gallop and no friction rub.  No murmur heard. Pulmonary/Chest: Effort normal and breath sounds normal. No stridor. No respiratory distress. She has no wheezes. She has no rales.  Neurological: She is alert. Coordination normal.  Skin: Skin is warm and dry. Rash noted. She is not diaphoretic. There is erythema.  Diffuse pinpoint erythematous rash over the bilateral posterior forearms and bilateral anterior lower extremities.    Psychiatric: She has a normal mood and affect. Her behavior is normal. Judgment and thought content normal.    Lab Review:     Component Value Date/Time   NA 141 01/01/2018 0843   K 3.8 01/01/2018 0843   CL 105 01/01/2018 0843   CO2 26 01/01/2018 0843   GLUCOSE 96 01/01/2018 0843   BUN 16 01/01/2018 0843   CREATININE 0.79 01/01/2018 0843   CREATININE 0.99 11/29/2017 0834   CALCIUM 9.6 01/01/2018 0843   PROT 7.9 11/29/2017 0834   ALBUMIN 4.2 11/29/2017 0834   AST 22 11/29/2017 0834  ALT 20 11/29/2017 0834   ALKPHOS 90 11/29/2017 0834   BILITOT 0.5 11/29/2017 0834   GFRNONAA >60 01/01/2018 0843   GFRNONAA 60 (L) 11/29/2017 0834   GFRAA >60 01/01/2018 0843   GFRAA >60 11/29/2017 0834       Component Value Date/Time   WBC 7.3 01/01/2018 0843   RBC 4.44 01/01/2018 0843   HGB 13.8 01/01/2018 0843   HGB 14.5 11/29/2017 0834   HCT 41.1 01/01/2018 0843   PLT 243 01/01/2018 0843   PLT 240 11/29/2017 0834   MCV 92.6 01/01/2018 0843   MCH 31.1 01/01/2018 0843   MCHC 33.6 01/01/2018 0843   RDW 13.2 01/01/2018 0843   LYMPHSABS 1.6 11/29/2017 0834   MONOABS 0.5 11/29/2017 0834   EOSABS 0.3 11/29/2017 0834   BASOSABS 0.1 11/29/2017 0834   -------------------------------  Imaging from last 24 hours (if applicable):  Radiology interpretation: No results found.

## 2018-07-02 ENCOUNTER — Other Ambulatory Visit: Payer: BLUE CROSS/BLUE SHIELD

## 2018-07-05 ENCOUNTER — Ambulatory Visit
Admission: RE | Admit: 2018-07-05 | Discharge: 2018-07-05 | Disposition: A | Discharge status: Home / Self Care | Payer: BLUE CROSS/BLUE SHIELD | Source: Ambulatory Visit | Attending: Oncology | Admitting: Oncology

## 2018-07-05 DIAGNOSIS — C50211 Malignant neoplasm of upper-inner quadrant of right female breast: Secondary | ICD-10-CM

## 2018-07-05 DIAGNOSIS — Z780 Asymptomatic menopausal state: Secondary | ICD-10-CM | POA: Diagnosis not present

## 2018-07-05 DIAGNOSIS — M85852 Other specified disorders of bone density and structure, left thigh: Secondary | ICD-10-CM | POA: Diagnosis not present

## 2018-07-05 DIAGNOSIS — Z170 Estrogen receptor positive status [ER+]: Principal | ICD-10-CM

## 2018-07-06 ENCOUNTER — Telehealth: Payer: Self-pay

## 2018-07-06 NOTE — Telephone Encounter (Signed)
-----   Message from Gardenia Phlegm, NP sent at 07/05/2018  3:34 PM EDT ----- Bone density shows early osteopenia.  Recommend calcium, vitamin d, weight bearing exercise.  Repeat in 2 years ----- Message ----- From: Interface, Rad Results In Sent: 07/05/2018   9:40 AM EDT To: Chauncey Cruel, MD

## 2018-07-06 NOTE — Telephone Encounter (Signed)
Spoke with patient informing of BD results and recommendations per NP. She voiced understanding. Patient also requested to reschedule her SCP visit to November.

## 2018-07-13 ENCOUNTER — Inpatient Hospital Stay: Payer: BLUE CROSS/BLUE SHIELD | Admitting: Adult Health

## 2018-07-19 ENCOUNTER — Other Ambulatory Visit: Payer: Self-pay | Admitting: *Deleted

## 2018-07-19 DIAGNOSIS — Z170 Estrogen receptor positive status [ER+]: Principal | ICD-10-CM

## 2018-07-19 DIAGNOSIS — C50211 Malignant neoplasm of upper-inner quadrant of right female breast: Secondary | ICD-10-CM

## 2018-07-20 NOTE — Progress Notes (Signed)
[place]  Telephone:([phone]) [phone] Fax:(336) [phone]     ID: Lisa Valenzuela DOB: 62-16-1957  MR#: [id number]  XNA#:[id number]  62-year-old Patient Care Team: Fanny Bien, MD as PCP - General (Family Medicine) Tazia Illescas, Virgie Dad, MD as Consulting Physician (Oncology) Alphonsa Overall, MD as Consulting Physician (General Surgery) Kyung Rudd, MD as Consulting Physician (Radiation Oncology) OTHER MD:  CHIEF COMPLAINT: Estrogen receptor positive lobular breast cancer  CURRENT TREATMENT: Tamoxifen   HISTORY OF CURRENT ILLNESS: From the original intake note:  [name] had routine screening mammography on 11/14/2017 showing a possible abnormality in her bilateral breasts. She underwent a bilateral diagnostic mammography with tomography and bilateral breast ultrasonography at [facility] on 11/17/2017 showing: breast density category C.  The left breast mass was evaluated and proved to be a benign cyst.  On the right however there was an area of architectural distortion, which could not be well delineated.  It was negative on ultrasonography.  The right axilla also was benign on ultrasound.  Accordingly on 11/21/2017 the patient proceeded to biopsy of the right breast area in question. 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 E-cadherin negative. The base of tumor is HER-2 negative and the proliferation marker Ki67 is 2%.   The patient's subsequent history is as detailed below.  INTERVAL HISTORY: [name] returns today for follow-up. She continues on tamoxifen, with good tolerance. She is having minimal hot flashes. She also has some increased vaginal discharge, and she wears a pad.   She completed a bone density on 07/05/2018 showing a T score of -1.3 (osteopenia). She takes 1,200 units of  calcium BID.    REVIEW OF SYSTEMS: Dwight reports that she went on a hike to [employer], and she felt winded and a little SOB. She denies having trouble going up the stairs, and she doesn't regularly go up stairs on a daily basis. She exercises on a treadmill 3 times per week for about 1.5 miles. She denies unusual headaches, visual changes, nausea, vomiting, or dizziness. There has been no unusual cough, phlegm production, or pleurisy. There has been no change in bowel or bladder habits. She denies unexplained fatigue or unexplained weight loss, bleeding, rash, or fever. A detailed review of systems was otherwise stable.    PAST MEDICAL HISTORY: Past Medical History:  Diagnosis Date  . Breast cancer ([place])   . Family history of breast cancer   . Genetic testing 12/25/2017   Breast/GYN panel (23 genes) @ [facility] - No pathogenic mutations detected  . Vitamin D deficiency     PAST SURGICAL HISTORY: Past Surgical History:  Procedure Laterality Date  . BREAST LUMPECTOMY WITH RADIOACTIVE SEED AND SENTINEL LYMPH NODE BIOPSY Bilateral 01/08/2018   Procedure: BILATERAL BREAST LUMPECTOMY WITH BILATERAL  RADIOACTIVE SEEDS AND RIGHT  SENTINEL LYMPH NODE BIOPSY;  Surgeon: Alphonsa Overall, MD;  Location: [place];  Service: General;  Laterality: Bilateral;  . BREAST SURGERY     bilateral breast bx    FAMILY HISTORY Family History  Problem Relation Age of Onset  . Breast cancer Mother 3       currently 16  . Prostate cancer Father 37       currently 51  . Breast cancer Sister 70       currently 8  . Colon cancer Paternal Uncle  dx 67s; deceased 49s  . Bladder Cancer Maternal Grandmother        dx 13s; deceased 55s  The patient's father, 35, and mother, 22 are still alive. Her father was diagnosed with prostate cancer at 31 years old. Her mother was diagnosed with breast cancer at 62 years old. The patient has two brothers and two sisters. One of her sisters was diagnoses with breast  cancer at 62 years old.   GYNECOLOGIC HISTORY:  No LMP recorded. Patient is postmenopausal. Menarche: 62 years old Age at first live birth: 62 years old [place] P 2 LMP 2009 Contraceptive yes, 1980-2000 HRT no  Hysterectomy? no SO?no  SOCIAL HISTORY:  Marche works as an Counselling psychologist. Her husband, Kasandra Knudsen is a Theme park manager at [employer].  They have one daughter, [name], 82, who lives in [place], [place], and is a middle school Associate Professor. They also have one son, [name], 44, who lives in [place], [place], and is a Physicist, medical. The patient has two grandchildren.     ADVANCED DIRECTIVES:    HEALTH MAINTENANCE: Social History   Tobacco Use  . Smoking status: Never Smoker  . Smokeless tobacco: Never Used  Substance Use Topics  . Alcohol use: No    Frequency: Never  . Drug use: No     Colonoscopy:  PAP:  Bone density: 07/05/2018 showing a T score of -1.3.   No Known Allergies  Current Outpatient Medications  Medication Sig Dispense Refill  . Biotin w/ Vitamins C & E (HAIR/SKIN/NAILS PO) Take 1 tablet by mouth daily.    Marland Kitchen BLACK COHOSH PO Take 1 capsule by mouth daily.    . Cholecalciferol (VITAMIN D3 PO) Take 1 tablet by mouth daily.    Marland Kitchen HYDROcodone-acetaminophen (NORCO/VICODIN) 5-325 MG tablet Take 1 tablet by mouth every 6 (six) hours as needed for moderate pain. (Patient not taking: Reported on 02/01/2018) 20 tablet 0  . loratadine (CLARITIN) 10 MG tablet Take 10 mg by mouth daily.    . minocycline (DYNACIN) 50 MG tablet Take 50 mg by mouth daily as needed. For rosacea  1  . Multiple Vitamin (MULTIVITAMIN WITH MINERALS) TABS tablet Take 1 tablet by mouth daily. Centrum Women's 50+    . triamcinolone lotion (KENALOG) 0.1 % Apply 1 application topically 3 (three) times daily. 60 mL 1   No current facility-administered medications for this visit.     OBJECTIVE: Middle-aged white woman who appears well  Vitals:   07/23/18 1027  BP: (!) 156/86    Pulse: 72  Resp: 18  Temp: 98.3 F (36.8 C)  SpO2: 100%     Body mass index is 26.8 kg/m.   Wt Readings from Last 3 Encounters:  07/23/18 146 lb 8 oz (66.5 kg)  04/24/18 146 lb 11.2 oz (66.5 kg)  04/12/18 146 lb 6.4 oz (66.4 kg)      ECOG FS:0 - Asymptomatic  Sclerae unicteric, pupils round and equal No cervical or supraclavicular adenopathy Lungs no rales or rhonchi Heart regular rate and rhythm Abd soft, nontender, positive bowel sounds MSK no focal spinal tenderness, no upper extremity lymphedema Neuro: nonfocal, well oriented, appropriate affect Breasts: She has had lumpectomies bilaterally; there is no evidence of disease recurrence; both axillae are benign.   LAB RESULTS:  CMP     Component Value Date/Time   NA 141 01/01/2018 0843   K 3.8 01/01/2018 0843   CL 105 01/01/2018 0843   CO2 26 01/01/2018 0843   GLUCOSE 96 01/01/2018  0843   BUN 16 01/01/2018 0843   CREATININE 0.79 01/01/2018 0843   CREATININE 0.99 11/29/2017 0834   CALCIUM 9.6 01/01/2018 0843   PROT 7.9 11/29/2017 0834   ALBUMIN 4.2 11/29/2017 0834   AST 22 11/29/2017 0834   ALT 20 11/29/2017 0834   ALKPHOS 90 11/29/2017 0834   BILITOT 0.5 11/29/2017 0834   GFRNONAA >60 01/01/2018 0843   GFRNONAA 60 (L) 11/29/2017 0834   GFRAA >60 01/01/2018 0843   GFRAA >60 11/29/2017 0834    No results found for: TOTALPROTELP, ALBUMINELP, A1GS, A2GS, BETS, BETA2SER, GAMS, MSPIKE, SPEI  No results found for: KPAFRELGTCHN, LAMBDASER, [facility]  Lab Results  Component Value Date   WBC 6.1 07/23/2018   NEUTROABS 4.3 07/23/2018   HGB 13.2 07/23/2018   HCT 38.8 07/23/2018   MCV 93.3 07/23/2018   PLT 181 07/23/2018    '@LASTCHEMISTRY' @  No results found for: LABCA2  No components found for: YQIHKV425  No results for input(s): INR in the last 168 hours.  No results found for: LABCA2  No results found for: ZDG387  No results found for: FIE332  No results found for: RJJ884  No results found  for: CA2729  No components found for: HGQUANT  No results found for: CEA1 / No results found for: CEA1   No results found for: AFPTUMOR  No results found for: CHROMOGRNA  No results found for: PSA1  Appointment on 07/23/2018  Component Date Value Ref Range Status  . WBC Count 07/23/2018 6.1  3.9 - 10.3 K/uL Final  . RBC 07/23/2018 4.15  3.70 - 5.45 MIL/uL Final  . Hemoglobin 07/23/2018 13.2  11.6 - 15.9 g/dL Final  . HCT 07/23/2018 38.8  34.8 - 46.6 % Final  . MCV 07/23/2018 93.3  79.5 - 101.0 fL Final  . MCH 07/23/2018 31.7  25.1 - 34.0 pg Final  . MCHC 07/23/2018 34.0  31.5 - 36.0 g/dL Final  . RDW 07/23/2018 13.1  11.2 - 14.5 % Final  . Platelet Count 07/23/2018 181  145 - 400 K/uL Final  . Neutrophils Relative % 07/23/2018 69  % Final  . Neutro Abs 07/23/2018 4.3  1.5 - 6.5 K/uL Final  . Lymphocytes Relative 07/23/2018 20  % Final  . Lymphs Abs 07/23/2018 1.2  0.9 - 3.3 K/uL Final  . Monocytes Relative 07/23/2018 7  % Final  . Monocytes Absolute 07/23/2018 0.4  0.1 - 0.9 K/uL Final  . Eosinophils Relative 07/23/2018 3  % Final  . Eosinophils Absolute 07/23/2018 0.2  0.0 - 0.5 K/uL Final  . Basophils Relative 07/23/2018 1  % Final  . Basophils Absolute 07/23/2018 0.0  0.0 - 0.1 K/uL Final   Performed at [facility], [place] [address], [place], [place] [address]    (this displays the last labs from the last 3 days)  No results found for: TOTALPROTELP, ALBUMINELP, A1GS, A2GS, BETS, BETA2SER, GAMS, MSPIKE, SPEI (this displays SPEP labs)  No results found for: KPAFRELGTCHN, LAMBDASER, KAPLAMBRATIO (kappa/lambda light chains)  No results found for: HGBA, HGBA2QUANT, HGBFQUANT, HGBSQUAN (Hemoglobinopathy evaluation)   No results found for: LDH  No results found for: IRON, TIBC, IRONPCTSAT (Iron and TIBC)  No results found for: FERRITIN  Urinalysis No results found for: COLORURINE, APPEARANCEUR, LABSPEC, PHURINE, GLUCOSEU, HGBUR, BILIRUBINUR,  KETONESUR, PROTEINUR, UROBILINOGEN, NITRITE, LEUKOCYTESUR   STUDIES: Dg Bone Density  Result Date: 07/05/2018 EXAM: DUAL X-RAY ABSORPTIOMETRY (DXA) FOR BONE MINERAL DENSITY IMPRESSION: Referring Physician:  Lurline Del Your patient completed  a BMD test using Lunar IDXA DXA system ( analysis version: 16 ) manufactured by [employer]. PATIENT: Name: NYLANI, MICHETTI Patient ID: [id number] Birth Date: 1956/08/07 Height: 62.0 in. Sex: Female Measured: 07/05/2018 Weight: 145.5 lbs. Indications: Breast Cancer History, Caucasian, Estrogen Deficient, Postmenopausal, Tamoxifen, Secondary Osteoporosis Fractures: Finger Treatments: Calcium (E943.0), Vitamin D (E933.5) ASSESSMENT: The BMD measured at Femur Neck Left is 0.852 g/cm2 with a T-score of -1.3. This patient is considered osteopenic according to [facility]) criteria. There has been a statistically significant decrease in BMD of left hip since prior exam dated 07/07/2011. The scan quality is good. DXA exam performed on prior Hologic device measured only unilateral hip (Total Mean was not measured), and therefore current or prior Total Mean cannot be compared. Site Region Measured Date Measured Age YA BMD Significant CHANGE T-score DualFemur Neck Left 07/05/2018 62.6 -1.3 0.852 g/cm2 * DualFemur Neck Left  07/07/2011    55.6         -0.8    0.926 g/cm2 AP Spine  L1-L4      07/05/2018    62.6         -0.9    1.076 g/cm2 AP Spine  L1-L4      07/07/2011    55.6         -0.8    1.083 g/cm2 DualFemur Total Mean 07/05/2018    62.6         -0.5    0.943 g/cm2 World Health Organization [facility]) criteria for post-menopausal, Caucasian Women: Normal       T-score at or above -1 SD Osteopenia   T-score between -1 and -2.5 SD Osteoporosis T-score at or below -2.5 SD RECOMMENDATION: 1. All patients should optimize calcium and vitamin D intake. 2. Consider FDA approved medical therapies in postmenopausal women and men aged 67 years and older, based on the  following: a. A hip or vertebral (clinical or morphometric) fracture b. T- score < or = -2.5 at the femoral neck or spine after appropriate evaluation to exclude secondary causes c. Low bone mass (T-score between -1.0 and -2.5 at the femoral neck or spine) and a 10 year probability of a hip fracture > or = 3% or a 10 year probability of a major osteoporosis-related fracture > or = 20% based on the [place]-adapted WHO algorithm d. Clinician judgment and/or patient preferences may indicate treatment for people with 10-year fracture probabilities above or below these levels FOLLOW-UP: People with diagnosed cases of osteoporosis or at high risk for fracture should have regular bone mineral density tests. For patients eligible for Medicare, routine testing is allowed once every 2 years. The testing frequency can be increased to one year for patients who have rapidly progressing disease, those who are receiving or discontinuing medical therapy to restore bone mass, or have additional risk factors. FRAX* 10-year Probability of Fracture Based on femoral neck BMD: DualFemur (Left) Major Osteoporotic Fracture: 8.1% Hip Fracture:                0.7% Population:                  [place] (Caucasian) Risk Factors:                Secondary Osteoporosis *FRAX is a [place] for Metabolic Bone Disease, a [place] (WHO) [facility]. ASSESSMENT: The probability of a major osteoporotic fracture is 8.1 % within the next ten years. The probability  of hip fracture is 0.7  % within the next 10 years. Electronically Signed   By: Earle Gell M.D.   On: 07/05/2018 09:37    ELIGIBLE FOR AVAILABLE RESEARCH PROTOCOL:no  ASSESSMENT: 62 y.o. [place], [place] woman status post right breast upper inner quadrant biopsy 11/21/2017 for a clinical TXN0 invasive lobular carcinoma, grade 2, estrogen and progesterone receptor positive, HER-2 not amplified, with an MIB-1 of 2%.  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 Right Breast / 42.56 Gy in 16 fractions 2. Boost / 8 Gy in 4 fractions Total dose of 50.56 Gy.  (4) tamoxifen started 04/12/2018  (5) genetics testing through the breast GYN panel at in [place] found no pathogenic mutations  PLAN: Tamey is now 6 months out from definitive surgery for her breast cancer.  She is tolerating tamoxifen remarkably well.  The plan will be to continue that a total of 5 years.  We reviewed her bone density results which are also favorable.  She has very minimal osteopenia.  She is on good calcium and vitamin D supplementation.  I have recommended that she start a regular walking program.  Tamoxifen of course will help with the bone density issues  She will see me again in April, after her mammogram next year.  At that point assuming all continues well I will start seeing her on a once a year basis  She knows to call for any other problems that may develop before then.   Draylon Mercadel, Virgie Dad, MD  07/23/18 10:39 AM Medical Oncology and Hematology [facility] [address] [place], [place] [address] Tel. [phone]    Fax. [phone]  Alice Rieger, am acting as scribe for Chauncey Cruel MD.  I, Lurline Del MD, have reviewed the above documentation for accuracy and completeness, and I agree with the above.

## 2018-07-23 ENCOUNTER — Inpatient Hospital Stay: Payer: BLUE CROSS/BLUE SHIELD | Attending: Oncology | Admitting: Oncology

## 2018-07-23 ENCOUNTER — Inpatient Hospital Stay: Payer: BLUE CROSS/BLUE SHIELD

## 2018-07-23 ENCOUNTER — Telehealth: Payer: Self-pay | Admitting: Oncology

## 2018-07-23 VITALS — BP 156/86 | HR 72 | Temp 98.3°F | Resp 18 | Ht 62.0 in | Wt 146.5 lb

## 2018-07-23 DIAGNOSIS — C50211 Malignant neoplasm of upper-inner quadrant of right female breast: Secondary | ICD-10-CM

## 2018-07-23 DIAGNOSIS — Z79810 Long term (current) use of selective estrogen receptor modulators (SERMs): Secondary | ICD-10-CM | POA: Insufficient documentation

## 2018-07-23 DIAGNOSIS — Z803 Family history of malignant neoplasm of breast: Secondary | ICD-10-CM | POA: Diagnosis not present

## 2018-07-23 DIAGNOSIS — Z8052 Family history of malignant neoplasm of bladder: Secondary | ICD-10-CM | POA: Diagnosis not present

## 2018-07-23 DIAGNOSIS — N898 Other specified noninflammatory disorders of vagina: Secondary | ICD-10-CM | POA: Diagnosis not present

## 2018-07-23 DIAGNOSIS — N951 Menopausal and female climacteric states: Secondary | ICD-10-CM | POA: Diagnosis not present

## 2018-07-23 DIAGNOSIS — Z800 Family history of malignant neoplasm of digestive organs: Secondary | ICD-10-CM | POA: Diagnosis not present

## 2018-07-23 DIAGNOSIS — Z170 Estrogen receptor positive status [ER+]: Secondary | ICD-10-CM

## 2018-07-23 DIAGNOSIS — M8580 Other specified disorders of bone density and structure, unspecified site: Secondary | ICD-10-CM | POA: Insufficient documentation

## 2018-07-23 DIAGNOSIS — Z923 Personal history of irradiation: Secondary | ICD-10-CM

## 2018-07-23 LAB — CBC WITH DIFFERENTIAL (CANCER CENTER ONLY)
BASOS ABS: 0 10*3/uL (ref 0.0–0.1)
Basophils Relative: 1 %
Eosinophils Absolute: 0.2 10*3/uL (ref 0.0–0.5)
Eosinophils Relative: 3 %
HEMATOCRIT: 38.8 % (ref 34.8–46.6)
Hemoglobin: 13.2 g/dL (ref 11.6–15.9)
LYMPHS PCT: 20 %
Lymphs Abs: 1.2 10*3/uL (ref 0.9–3.3)
MCH: 31.7 pg (ref 25.1–34.0)
MCHC: 34 g/dL (ref 31.5–36.0)
MCV: 93.3 fL (ref 79.5–101.0)
Monocytes Absolute: 0.4 10*3/uL (ref 0.1–0.9)
Monocytes Relative: 7 %
NEUTROS ABS: 4.3 10*3/uL (ref 1.5–6.5)
Neutrophils Relative %: 69 %
Platelet Count: 181 10*3/uL (ref 145–400)
RBC: 4.15 MIL/uL (ref 3.70–5.45)
RDW: 13.1 % (ref 11.2–14.5)
WBC Count: 6.1 10*3/uL (ref 3.9–10.3)

## 2018-07-23 LAB — CMP (CANCER CENTER ONLY)
ALBUMIN: 3.9 g/dL (ref 3.5–5.0)
ALT: 17 U/L (ref 0–44)
ANION GAP: 8 (ref 5–15)
AST: 22 U/L (ref 15–41)
Alkaline Phosphatase: 62 U/L (ref 38–126)
BUN: 15 mg/dL (ref 8–23)
CO2: 28 mmol/L (ref 22–32)
Calcium: 9.3 mg/dL (ref 8.9–10.3)
Chloride: 108 mmol/L (ref 98–111)
Creatinine: 0.9 mg/dL (ref 0.44–1.00)
GFR, Est AFR Am: >60 mL/min (ref >60)
GFR, Estimated: >60 mL/min (ref >60)
GLUCOSE: 97 mg/dL (ref 70–99)
POTASSIUM: 4.1 mmol/L (ref 3.5–5.1)
SODIUM: 144 mmol/L (ref 135–145)
TOTAL PROTEIN: 7.1 g/dL (ref 6.5–8.1)
Total Bilirubin: 0.4 mg/dL (ref 0.3–1.2)

## 2018-07-23 MED ORDER — TAMOXIFEN CITRATE 20 MG PO TABS: 20.0000 mg | ORAL_TABLET | Freq: Every day | ORAL | 12 refills | Status: AC

## 2018-07-23 NOTE — Telephone Encounter (Signed)
Gave pt avs and calendar  °

## 2018-08-23 ENCOUNTER — Telehealth: Payer: Self-pay

## 2018-08-23 NOTE — Telephone Encounter (Signed)
Spoke with pt reminding of SCP visit with NP on 08/28/18 at 1 pm.  Pt said she will come to appt.

## 2018-08-28 ENCOUNTER — Encounter: Payer: Self-pay | Admitting: Adult Health

## 2018-08-28 ENCOUNTER — Inpatient Hospital Stay: Payer: BLUE CROSS/BLUE SHIELD | Attending: Oncology | Admitting: Adult Health

## 2018-08-28 VITALS — BP 134/67 | HR 63 | Temp 98.2°F | Resp 17 | Ht 62.0 in | Wt 148.7 lb

## 2018-08-28 DIAGNOSIS — M8580 Other specified disorders of bone density and structure, unspecified site: Secondary | ICD-10-CM | POA: Diagnosis not present

## 2018-08-28 DIAGNOSIS — C50211 Malignant neoplasm of upper-inner quadrant of right female breast: Secondary | ICD-10-CM | POA: Diagnosis not present

## 2018-08-28 DIAGNOSIS — Z170 Estrogen receptor positive status [ER+]: Secondary | ICD-10-CM | POA: Insufficient documentation

## 2018-08-28 DIAGNOSIS — Z803 Family history of malignant neoplasm of breast: Secondary | ICD-10-CM | POA: Insufficient documentation

## 2018-08-28 DIAGNOSIS — Z8052 Family history of malignant neoplasm of bladder: Secondary | ICD-10-CM | POA: Diagnosis not present

## 2018-08-28 DIAGNOSIS — Z79810 Long term (current) use of selective estrogen receptor modulators (SERMs): Secondary | ICD-10-CM | POA: Insufficient documentation

## 2018-08-28 DIAGNOSIS — Z800 Family history of malignant neoplasm of digestive organs: Secondary | ICD-10-CM | POA: Insufficient documentation

## 2018-08-28 NOTE — Progress Notes (Signed)
CLINIC:  Survivorship   REASON FOR VISIT:  Routine follow-up post-treatment for a recent history of breast cancer.  BRIEF ONCOLOGIC HISTORY:    Malignant neoplasm of upper-inner quadrant of right breast in female, estrogen receptor positive ([place])   11/21/2017 Initial Biopsy    status post right breast upper inner quadrant biopsy 11/21/2017 for a clinical TXN0 invasive lobular carcinoma, grade 2, estrogen and progesterone receptor positive, HER-2 not amplified, with an MIB-1 of 2%.             (a) right breast upper outer quadrant biopsy 12/18/2017 showed no malignancy             (b) left breast retroareolar biopsy 12/18/2017 showed an atypical papillary lesion             (c) left lumpectomy 01/08/2018 showed an intraductal papilloma with no malignancy    11/28/2017 Initial Diagnosis    Malignant neoplasm of upper-inner quadrant of right breast in female, estrogen receptor positive ([place])    12/25/2017 Genetic Testing    Genetic testing was negative. Genes tested include: ATM, BARD1, BRCA1, BRCA2, BRIP1, CDH1, CHEK2, DICER1, EPCAM, MLH1,  MSH2, MSH6, NBN, NF1, PALB2, PMS2, PTEN, RAD50, RAD51C, RAD51D,SMARCA4, STK11, and TP53.    01/08/2018 Surgery    right lumpectomy and sentinel lymph node sampling showed a pT1b pN0, stage IA invasive lobular carcinoma, grade 1, with negative margins             (a) 2 right axillary lymph nodes were removed    02/15/2018 - 03/15/2018 Radiation Therapy    Site/dose: 1. Right Breast / 42.56 Gy in 16 fractions 2. Boost / 8 Gy in 4 fractions Total dose of 50.56 Gy.    04/12/2018 -  Anti-estrogen oral therapy    Tamoxifen daily     INTERVAL HISTORY:  Ms. Barber presents to the [facility] today for our initial meeting to review her survivorship care plan detailing her treatment course for breast cancer, as well as monitoring long-term side effects of that treatment, education regarding health maintenance, screening, and overall wellness and health  promotion.     Overall, Ms. Plath reports feeling quite well.  She is taking Tamoxifen.  She notes some hot flashes which are manageable for her.  She does have some vaginal discharge, this is not particularly bothersome and she wears panty liners to deal with this.      REVIEW OF SYSTEMS:  Review of Systems  Constitutional: Negative for appetite change, chills, fatigue, fever and unexpected weight change.  HENT:   Negative for hearing loss, lump/mass, sore throat and trouble swallowing.   Eyes: Negative for eye problems and icterus.  Respiratory: Negative for chest tightness, cough and shortness of breath.   Cardiovascular: Negative for chest pain, leg swelling and palpitations.  Gastrointestinal: Negative for abdominal distention, abdominal pain, constipation, diarrhea, nausea and vomiting.  Endocrine: Negative for hot flashes.  Skin: Negative for itching and rash.  Neurological: Negative for dizziness, extremity weakness, headaches and numbness.  Hematological: Negative for adenopathy. Does not bruise/bleed easily.  Psychiatric/Behavioral: Negative for depression. The patient is not nervous/anxious.   Breast: Denies any new nodularity, masses, tenderness, nipple changes, or nipple discharge.      ONCOLOGY TREATMENT TEAM:  1. Surgeon:  Dr. Lucia Gaskins at [facility] 2. Medical Oncologist: Dr. Jana Hakim  3. Radiation Oncologist: Dr. Lisbeth Renshaw    PAST MEDICAL/SURGICAL HISTORY:  Past Medical History:  Diagnosis Date  . Breast cancer ([place])   .  Family history of breast cancer   . Genetic testing 12/25/2017   Breast/GYN panel (23 genes) @ [facility] - No pathogenic mutations detected  . Vitamin D deficiency    Past Surgical History:  Procedure Laterality Date  . BREAST LUMPECTOMY WITH RADIOACTIVE SEED AND SENTINEL LYMPH NODE BIOPSY Bilateral 01/08/2018   Procedure: BILATERAL BREAST LUMPECTOMY WITH BILATERAL  RADIOACTIVE SEEDS AND RIGHT  SENTINEL LYMPH NODE BIOPSY;  Surgeon:  Alphonsa Overall, MD;  Location: [place];  Service: General;  Laterality: Bilateral;  . BREAST SURGERY     bilateral breast bx     ALLERGIES:  No Known Allergies   CURRENT MEDICATIONS:  Outpatient Encounter Medications as of 08/28/2018  Medication Sig  . Biotin w/ Vitamins C & E (HAIR/SKIN/NAILS PO) Take 1 tablet by mouth daily.  Marland Kitchen BLACK COHOSH PO Take 1 capsule by mouth daily.  . calcium-vitamin D (OSCAL WITH D) 500-200 MG-UNIT tablet Take 1 tablet by mouth.  . Cholecalciferol (VITAMIN D3 PO) Take 1 tablet by mouth daily.  . minocycline (DYNACIN) 50 MG tablet Take 50 mg by mouth daily as needed. For rosacea  . Multiple Vitamin (MULTIVITAMIN WITH MINERALS) TABS tablet Take 1 tablet by mouth daily. Centrum Women's 50+   No facility-administered encounter medications on file as of 08/28/2018.      ONCOLOGIC FAMILY HISTORY:  Family History  Problem Relation Age of Onset  . Breast cancer Mother 47       currently 70  . Prostate cancer Father 14       currently 11  . Breast cancer Sister 14       currently 56  . Colon cancer Paternal Uncle        dx 41s; deceased 66s  . Bladder Cancer Maternal Grandmother        dx 83s; deceased 96s     GENETIC COUNSELING/TESTING: neg  SOCIAL HISTORY:  Social History   Socioeconomic History  . Marital status: Married    Spouse name: Not on file  . Number of children: Not on file  . Years of education: Not on file  . Highest education level: Not on file  Occupational History  . Not on file  Social Needs  . Financial resource strain: Not on file  . Food insecurity:    Worry: Not on file    Inability: Not on file  . Transportation needs:    Medical: Not on file    Non-medical: Not on file  Tobacco Use  . Smoking status: Never Smoker  . Smokeless tobacco: Never Used  Substance and Sexual Activity  . Alcohol use: No    Frequency: Never  . Drug use: No  . Sexual activity: Yes  Lifestyle  . Physical activity:    Days per week:  Not on file    Minutes per session: Not on file  . Stress: Not on file  Relationships  . Social connections:    Talks on phone: Not on file    Gets together: Not on file    Attends religious service: Not on file    Active member of club or organization: Not on file    Attends meetings of clubs or organizations: Not on file    Relationship status: Not on file  . Intimate partner violence:    Fear of current or ex partner: Not on file    Emotionally abused: Not on file    Physically abused: Not on file    Forced sexual activity: Not on file  Other Topics Concern  . Not on file  Social History Narrative  . Not on file     PHYSICAL EXAMINATION:  Vital Signs:   Vitals:   08/28/18 1251  BP: 134/67  Pulse: 63  Resp: 17  Temp: 98.2 F (36.8 C)  SpO2: 98%   Filed Weights   08/28/18 1251  Weight: 148 lb 11.2 oz (67.4 kg)   General: Well-nourished, well-appearing female in no acute distress.  She is unaccompanied today.   HEENT: Head is normocephalic.  Pupils equal and reactive to light. Conjunctivae clear without exudate.  Sclerae anicteric. Oral mucosa is pink, moist.  Oropharynx is pink without lesions or erythema.  Lymph: No cervical, supraclavicular, or infraclavicular lymphadenopathy noted on palpation.  Cardiovascular: Regular rate and rhythm.Marland Kitchen Respiratory: Clear to auscultation bilaterally. Chest expansion symmetric; breathing non-labored.  Breasts: right breast s/p lumpectomy and radiation, no sign of local recurrence, left breast s/p lumpectomy, benign GI: Abdomen soft and round; non-tender, non-distended. Bowel sounds normoactive.  GU: Deferred.  Neuro: No focal deficits. Steady gait.  Psych: Mood and affect normal and appropriate for situation.  Extremities: No edema. MSK: No focal spinal tenderness to palpation.  Full range of motion in bilateral upper extremities Skin: Warm and dry.  LABORATORY DATA:  None for this visit.  DIAGNOSTIC IMAGING:  None for this  visit.      ASSESSMENT AND PLAN:  Ms.. Pomerleau is a pleasant 62 y.o. 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  She presents to the Survivorship Clinic for our initial meeting and routine follow-up post-completion of treatment for breast cancer.    1. Stage IA right breast cancer:  Ms. Suber is continuing to recover from definitive treatment for breast cancer. She will follow-up with her medical oncologist, Dr. Jana Hakim in 02/2019 with history and physical exam per surveillance protocol.  I have ordered her next mammogram for end of January, 2020 when it is due.  She will continue her anti-estrogen therapy with Tamoxifen. Thus far, she is tolerating the Tamoxifen well, with minimal side effects.  We reviewed that while taking Tamoxifen she will need to have annual gynecologic examinations.  She does not have a gynecologist, and I have referred her to one today.  Today, a comprehensive survivorship care plan and treatment summary was reviewed with the patient today detailing her breast cancer diagnosis, treatment course, potential late/long-term effects of treatment, appropriate follow-up care with recommendations for the future, and patient education resources.  A copy of this summary, along with a letter will be sent to the patient's primary care provider via mail/fax/In Basket message after today's visit.    2. Bone health:  Given Ms. Drzewiecki age/history of breast cancer, she is at risk for bone demineralization.  Her last DEXA scan was 07/05/2018 that showed osteopenia with a  T score of -1.3 in the left femur.  In the meantime, she was encouraged to increase her consumption of foods rich in calcium, as well as increase her weight-bearing activities.  She was given education on specific activities to promote bone health.  3. Cancer  screening:  Due to Ms. Lacina history and her age, she should receive screening for skin cancers, colon cancer, and gynecologic cancers.  The information and recommendations are listed on the patient's comprehensive care plan/treatment summary and were reviewed in detail with the patient.    4. Health maintenance and  wellness promotion: Ms. Portner was encouraged to consume 5-7 servings of fruits and vegetables per day. We reviewed the "Nutrition Rainbow" handout, as well as the handout "Take Control of Your Health and Reduce Your Cancer Risk" from the [employer].  She was also encouraged to engage in moderate to vigorous exercise for 30 minutes per day most days of the week. We discussed the [employer] fitness program, which is designed for cancer survivors to help them become more physically fit after cancer treatments.  She was instructed to limit her alcohol consumption and continue to abstain from tobacco use.     5. Support services/counseling: It is not uncommon for this period of the patient's cancer care trajectory to be one of many emotions and stressors.  We discussed an opportunity for her to participate in the next session of [facility] ("Finding Your New Normal") support group series designed for patients after they have completed treatment.   Ms. Brodman was encouraged to take advantage of our many other support services programs, support groups, and/or counseling in coping with her new life as a cancer survivor after completing anti-cancer treatment.  She was offered support today through active listening and expressive supportive counseling.  She was given information regarding our available services and encouraged to contact me with any questions or for help enrolling in any of our support group/programs.    Dispo:   -Return to cancer center for f/u with Dr. Jana Hakim in 02/2019 -Mammogram due in 11/2018 -Follow up with Dr. Lucia Gaskins 08/2018 -She is welcome to return back to the  Survivorship Clinic at any time; no additional follow-up needed at this time.  -Consider referral back to survivorship as a long-term survivor for continued surveillance  A total of (30) minutes of face-to-face time was spent with this patient with greater than 50% of that time in counseling and care-coordination.   Gardenia Phlegm, NP Survivorship Program [facility] [phone]   Note: PRIMARY CARE PROVIDER Fanny Bien, [place] [phone]

## 2018-08-29 ENCOUNTER — Encounter: Payer: Self-pay | Admitting: Obstetrics & Gynecology

## 2018-08-29 DIAGNOSIS — K635 Polyp of colon: Secondary | ICD-10-CM | POA: Diagnosis not present

## 2018-08-29 DIAGNOSIS — Z23 Encounter for immunization: Secondary | ICD-10-CM | POA: Diagnosis not present

## 2018-08-29 DIAGNOSIS — Z6826 Body mass index (BMI) 26.0-26.9, adult: Secondary | ICD-10-CM | POA: Diagnosis not present

## 2018-09-04 DIAGNOSIS — R635 Abnormal weight gain: Secondary | ICD-10-CM | POA: Diagnosis not present

## 2018-09-04 DIAGNOSIS — Z800 Family history of malignant neoplasm of digestive organs: Secondary | ICD-10-CM | POA: Diagnosis not present

## 2018-09-04 DIAGNOSIS — K5730 Diverticulosis of large intestine without perforation or abscess without bleeding: Secondary | ICD-10-CM | POA: Diagnosis not present

## 2018-09-04 DIAGNOSIS — Z1211 Encounter for screening for malignant neoplasm of colon: Secondary | ICD-10-CM | POA: Diagnosis not present

## 2018-09-06 DIAGNOSIS — C50911 Malignant neoplasm of unspecified site of right female breast: Secondary | ICD-10-CM | POA: Diagnosis not present

## 2018-09-06 DIAGNOSIS — Z170 Estrogen receptor positive status [ER+]: Secondary | ICD-10-CM | POA: Diagnosis not present

## 2018-09-26 DIAGNOSIS — K621 Rectal polyp: Secondary | ICD-10-CM | POA: Diagnosis not present

## 2018-09-26 DIAGNOSIS — D128 Benign neoplasm of rectum: Secondary | ICD-10-CM | POA: Diagnosis not present

## 2018-09-26 DIAGNOSIS — Z1211 Encounter for screening for malignant neoplasm of colon: Secondary | ICD-10-CM | POA: Diagnosis not present

## 2018-09-26 DIAGNOSIS — D120 Benign neoplasm of cecum: Secondary | ICD-10-CM | POA: Diagnosis not present

## 2018-09-26 DIAGNOSIS — K5730 Diverticulosis of large intestine without perforation or abscess without bleeding: Secondary | ICD-10-CM | POA: Diagnosis not present

## 2018-09-26 DIAGNOSIS — K635 Polyp of colon: Secondary | ICD-10-CM | POA: Diagnosis not present

## 2018-11-19 ENCOUNTER — Ambulatory Visit
Admission: RE | Admit: 2018-11-19 | Discharge: 2018-11-19 | Disposition: A | Discharge status: Home / Self Care | Payer: BLUE CROSS/BLUE SHIELD | Source: Ambulatory Visit | Attending: Adult Health | Admitting: Adult Health

## 2018-11-19 DIAGNOSIS — R922 Inconclusive mammogram: Secondary | ICD-10-CM | POA: Diagnosis not present

## 2018-11-19 DIAGNOSIS — Z170 Estrogen receptor positive status [ER+]: Principal | ICD-10-CM

## 2018-11-19 DIAGNOSIS — C50211 Malignant neoplasm of upper-inner quadrant of right female breast: Secondary | ICD-10-CM

## 2018-11-19 HISTORY — DX: Personal history of irradiation: Z92.3

## 2018-12-17 DIAGNOSIS — Z0000 Encounter for general adult medical examination without abnormal findings: Secondary | ICD-10-CM | POA: Diagnosis not present

## 2018-12-17 DIAGNOSIS — Z1329 Encounter for screening for other suspected endocrine disorder: Secondary | ICD-10-CM | POA: Diagnosis not present

## 2018-12-17 DIAGNOSIS — Z13220 Encounter for screening for lipoid disorders: Secondary | ICD-10-CM | POA: Diagnosis not present

## 2018-12-17 DIAGNOSIS — Z114 Encounter for screening for human immunodeficiency virus [HIV]: Secondary | ICD-10-CM | POA: Diagnosis not present

## 2018-12-21 DIAGNOSIS — Z6826 Body mass index (BMI) 26.0-26.9, adult: Secondary | ICD-10-CM | POA: Diagnosis not present

## 2018-12-21 DIAGNOSIS — Z23 Encounter for immunization: Secondary | ICD-10-CM | POA: Diagnosis not present

## 2018-12-21 DIAGNOSIS — Z0000 Encounter for general adult medical examination without abnormal findings: Secondary | ICD-10-CM | POA: Diagnosis not present

## 2019-02-12 ENCOUNTER — Telehealth: Payer: Self-pay | Admitting: Oncology

## 2019-02-12 NOTE — Telephone Encounter (Signed)
Per 4/24 schedule message moved 5/4 lab/fu to 6/16. Spoke with patient.

## 2019-02-18 ENCOUNTER — Other Ambulatory Visit: Payer: BLUE CROSS/BLUE SHIELD

## 2019-02-18 ENCOUNTER — Ambulatory Visit: Payer: BLUE CROSS/BLUE SHIELD | Admitting: Oncology

## 2019-02-21 ENCOUNTER — Ambulatory Visit: Payer: BLUE CROSS/BLUE SHIELD | Admitting: Obstetrics & Gynecology

## 2019-04-01 ENCOUNTER — Other Ambulatory Visit: Payer: Self-pay

## 2019-04-01 ENCOUNTER — Encounter: Payer: Self-pay | Admitting: Obstetrics & Gynecology

## 2019-04-01 ENCOUNTER — Ambulatory Visit: Payer: BLUE CROSS/BLUE SHIELD | Admitting: Obstetrics & Gynecology

## 2019-04-01 ENCOUNTER — Ambulatory Visit (INDEPENDENT_AMBULATORY_CARE_PROVIDER_SITE_OTHER): Payer: BC Managed Care – PPO | Admitting: Obstetrics & Gynecology

## 2019-04-01 VITALS — BP 138/74 | HR 76 | Resp 16 | Ht 62.5 in | Wt 146.2 lb

## 2019-04-01 DIAGNOSIS — Z01419 Encounter for gynecological examination (general) (routine) without abnormal findings: Secondary | ICD-10-CM | POA: Diagnosis not present

## 2019-04-01 MED ORDER — MINOCYCLINE HCL 50 MG PO TABS: 50.0000 mg | ORAL_TABLET | Freq: Every day | ORAL | 3 refills | Status: DC | PRN

## 2019-04-01 NOTE — Patient Instructions (Signed)
Lisa Valenzuela's Butt Paste Aquaphor

## 2019-04-01 NOTE — Progress Notes (Addendum)
63 y.o. G2P0 Married White or Caucasian female here for annual exam.  She is a new patient today.  Patient states that she would like a refill on Minocycline 50mg .   Diagnosed with invasive lobar breast cancer 2/19.  Treated with radiation.  On Tamoxifen for five years.    No LMP recorded. Patient is postmenopausal.          Sexually active: Yes.    The current method of family planning is post menopausal status.    Exercising: Yes.    Walking or work out videos  Smoker:  no  Health Maintenance: Pap:  12/21/2017 neg with neg HR HPV History of abnormal Pap:  no MMG: 11/19/18  Density c Bi-rads 2 benign  Colonoscopy:  12/19 adenomatous polyp, follow up 5 years BMD:   07/05/18 osteopenic repeat in 2 years  TDaP:  2011 Pneumonia vaccine(s):  yes Shingrix:   yes Hep C testing: not sure  Screening Labs: pcp   reports that she has never smoked. She has never used smokeless tobacco. She reports that she does not drink alcohol or use drugs.  Past Medical History:  Diagnosis Date  . Breast cancer ([place])   . Family history of breast cancer   . Genetic testing 12/25/2017   Breast/GYN panel (23 genes) @ [facility] - No pathogenic mutations detected  . Personal history of radiation therapy   . Vitamin D deficiency     Past Surgical History:  Procedure Laterality Date  . BREAST LUMPECTOMY    . BREAST LUMPECTOMY WITH RADIOACTIVE SEED AND SENTINEL LYMPH NODE BIOPSY Bilateral 01/08/2018   Procedure: BILATERAL BREAST LUMPECTOMY WITH BILATERAL  RADIOACTIVE SEEDS AND RIGHT  SENTINEL LYMPH NODE BIOPSY;  Surgeon: Alphonsa Overall, MD;  Location: [place];  Service: General;  Laterality: Bilateral;  . BREAST SURGERY     bilateral breast bx    Current Outpatient Medications  Medication Sig Dispense Refill  . Biotin w/ Vitamins C & E (HAIR/SKIN/NAILS PO) Take 1 tablet by mouth daily.    . calcium-vitamin D (OSCAL WITH D) 500-200 MG-UNIT tablet Take 1 tablet by mouth.    . Cholecalciferol (VITAMIN D3 PO) Take 1  tablet by mouth daily.    . minocycline (DYNACIN) 50 MG tablet Take 50 mg by mouth daily as needed. For rosacea  1  . Multiple Vitamin (MULTIVITAMIN WITH MINERALS) TABS tablet Take 1 tablet by mouth daily. Centrum Women's 50+    . tamoxifen (NOLVADEX) 20 MG tablet Take 20 mg by mouth daily.     No current facility-administered medications for this visit.     Family History  Problem Relation Age of Onset  . Breast cancer Mother 32       currently 65  . Prostate cancer Father 77       currently 7  . Breast cancer Sister 52       currently 47  . Colon cancer Paternal Uncle        dx 61s; deceased 47s  . Bladder Cancer Maternal Grandmother        dx 68s; deceased 68s    Review of Systems  All other systems reviewed and are negative.   Exam:   BP 138/74   Pulse 76   Resp 16   Ht 5' 2.5" (1.588 m)   Wt 146 lb 3.2 oz (66.3 kg)   BMI 26.31 kg/m   Height: 5' 2.5" (158.8 cm)  Ht Readings from Last 3 Encounters:  04/01/19 5' 2.5" (1.588 m)  08/28/18 5\' 2"  (1.575 m)  07/23/18 5\' 2"  (1.575 m)    General appearance: alert, cooperative and appears stated age Head: Normocephalic, without obvious abnormality, atraumatic Neck: no adenopathy, supple, symmetrical, trachea midline and thyroid normal to inspection and palpation Lungs: clear to auscultation bilaterally Breasts: normal appearance, no masses or tenderness, well healed scars on both breasts Heart: regular rate and rhythm Abdomen: soft, non-tender; bowel sounds normal; no masses,  no organomegaly Extremities: extremities normal, atraumatic, no cyanosis or edema Skin: Skin color, texture, turgor normal. No rashes or lesions Lymph nodes: Cervical, supraclavicular, and axillary nodes normal. No abnormal inguinal nodes palpated Neurologic: Grossly normal   Pelvic: External genitalia:  no lesions              Urethra:  normal appearing urethra with no masses, tenderness or lesions              Bartholins and Skenes: normal                  Vagina: normal appearing vagina with normal color and discharge, no lesions              Cervix: no lesions              Pap taken: No. Bimanual Exam:  Uterus:  normal size, contour, position, consistency, mobility, non-tender              Adnexa: normal adnexa and no mass, fullness, tenderness               Rectovaginal: Confirms               Anus:  normal sphincter tone, no lesions  Chaperone was present for exam.  A:  Well Woman with normal exam PMP, no HRT H/o lobar ca 2019, s/p radiation and now on Tamoxifen Strong family hx (pt had negative testing) Family hx of colon cancer paternal uncle Rosacea Osteopenia  P:   Mammogram guidelines reviewed.  Doing yearly.   pap smear neg with neg HR HPV 2019.  Not indicated today. Has lab work tomorrow with Dr. Jana Hakim Release of records for last pap, vaccinations and lab work  Colonoscopy due 2024 BMD due next year RF for minocycline 50mg  prn rosacea flare.  #30/3RF return annually or prn

## 2019-04-01 NOTE — Progress Notes (Signed)
[facility]  [phone] [phone]     ID: Lisa Valenzuela DOB: 10-24-1955  MR#: [id number]  QJJ#:[id number]  Patient Care Team: Fanny Bien, MD as PCP - General (Family Medicine) Jervon Ream, Virgie Dad, MD as Consulting Physician (Oncology) Alphonsa Overall, MD as Consulting Physician (General Surgery) Kyung Rudd, MD as Consulting Physician (Radiation Oncology) Delice Bison, Charlestine Massed, NP as Nurse Practitioner (Hematology and Oncology) OTHER MD:  CHIEF COMPLAINT: Estrogen receptor positive lobular breast cancer  CURRENT TREATMENT: Tamoxifen   INTERVAL HISTORY: [name] returns today for follow-up of her estrogen receptor positive breast cancer.  She continues on tamoxifen, with good tolerance. She reports minimal vaginal discharge and an increase in leg cramps, mostly at night.   Since her last visit, she underwent bilateral diagnostic mammography with tomography at [facility] on 11/19/2018 showing: breast density category C; no evidence of malignancy in either breast.    REVIEW OF SYSTEMS: Willette denies any changes in her breast. She notes she is not scheduled to see her surgeon. She reports walking around her neighborhood, about a mile, for exercise. She has been working overtime and in her office throughout the pandemic. She notes they don't have people come to the office if at all possible. She states her husband, who is a Company secretary, is back to preaching as of two weeks ago. A detailed review of systems was otherwise entirely negative.   HISTORY OF CURRENT ILLNESS: From the original intake note:  Lisa Valenzuela had routine screening mammography on 11/14/2017 showing a possible abnormality in her bilateral breasts. She underwent a bilateral diagnostic mammography with tomography and bilateral breast ultrasonography at [facility] on 11/17/2017 showing: breast density category C.  The left breast mass was evaluated and proved to be a  benign cyst.  On the right however there was an area of architectural distortion, which could not be well delineated.  It was negative on ultrasonography.  The right axilla also was benign on ultrasound.  Accordingly on 11/21/2061 the patient proceeded to biopsy of the right breast area in question. 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 E-cadherin negative. The base of tumor is HER-2 negative and the proliferation marker Ki67 is 2%.   The patient's subsequent history is as detailed below.   PAST MEDICAL HISTORY: Past Medical History:  Diagnosis Date  . Breast cancer ([place])   . Family history of breast cancer   . Genetic testing 12/25/2017   Breast/GYN panel (23 genes) @ [facility] - No pathogenic mutations detected  . Personal history of radiation therapy   . Vitamin D deficiency     PAST SURGICAL HISTORY: Past Surgical History:  Procedure Laterality Date  . BREAST LUMPECTOMY    . BREAST LUMPECTOMY WITH RADIOACTIVE SEED AND SENTINEL LYMPH NODE BIOPSY Bilateral 01/08/2018   Procedure: BILATERAL BREAST LUMPECTOMY WITH BILATERAL  RADIOACTIVE SEEDS AND RIGHT  SENTINEL LYMPH NODE BIOPSY;  Surgeon: Alphonsa Overall, MD;  Location: [facility];  Service: General;  Laterality: Bilateral;  . BREAST SURGERY     bilateral breast bx    FAMILY HISTORY Family History  Problem Relation Age of Onset  . Breast cancer Mother 22       currently 61  . Prostate cancer Father 62       currently 50  . Breast cancer Sister 19  currently 56  . Colon cancer Paternal Uncle        dx 26s; deceased 62s  . Bladder Cancer Maternal Grandmother        dx 68s; deceased 19s  The patient's father, 4, and mother, 85 are still alive. Her father was diagnosed with prostate cancer at 24 years old. Her mother was diagnosed with breast  cancer at 63 years old. The patient has two brothers and two sisters. One of her sisters was diagnoses with breast cancer at 63 years old.   GYNECOLOGIC HISTORY:  No LMP recorded. Patient is postmenopausal. Menarche: 63 years old Age at first live birth: 63 years old [place] P 2 LMP 2009 Contraceptive yes, 1980-2000 HRT no  Hysterectomy? no SO?no  SOCIAL HISTORY:  Marybelle works as an Counselling psychologist. Her husband, Kasandra Knudsen is a Theme park manager at [employer].  They have one daughter, [name], 54, who lives in [place], [place], and is a middle school Associate Professor. They also have one son, [name], 3, who lives in [place], [place], and is a Physicist, medical. The patient has two grandchildren.     ADVANCED DIRECTIVES:    HEALTH MAINTENANCE: Social History   Tobacco Use  . Smoking status: Never Smoker  . Smokeless tobacco: Never Used  Substance Use Topics  . Alcohol use: No    Frequency: Never  . Drug use: No     Colonoscopy:  PAP:  Bone density: 07/05/2018 showing a T score of -1.3.   No Known Allergies  Current Outpatient Medications  Medication Sig Dispense Refill  . Biotin w/ Vitamins C & E (HAIR/SKIN/NAILS PO) Take 1 tablet by mouth daily.    . calcium-vitamin D (OSCAL WITH D) 500-200 MG-UNIT tablet Take 1 tablet by mouth.    . Cholecalciferol (VITAMIN D3 PO) Take 1 tablet by mouth daily.    . minocycline (DYNACIN) 50 MG tablet Take 1 tablet (50 mg total) by mouth daily as needed. For rosacea 30 tablet 3  . Multiple Vitamin (MULTIVITAMIN WITH MINERALS) TABS tablet Take 1 tablet by mouth daily. [facility] Women's 50+    . tamoxifen (NOLVADEX) 20 MG tablet Take 20 mg by mouth daily.     No current facility-administered medications for this visit.     OBJECTIVE: Middle-aged white woman in no acute distress  Vitals:   04/02/19 1207  BP: 109/70  Pulse: 71  Resp: 18  Temp: 98 F (36.7 C)  SpO2: 99%     Body mass index is 26.64 kg/m.   Wt Readings from Last 3  Encounters:  04/02/19 148 lb (67.1 kg)  04/01/19 146 lb 3.2 oz (66.3 kg)  08/28/18 148 lb 11.2 oz (67.4 kg)      ECOG FS:0 - Asymptomatic  Sclerae unicteric, EOMs intact No cervical or supraclavicular adenopathy Lungs no rales or rhonchi Heart regular rate and rhythm Abd soft, nontender, positive bowel sounds MSK no focal spinal tenderness, no upper extremity lymphedema Neuro: nonfocal, well oriented, appropriate affect Breasts: Status post bilateral lumpectomies.  No evidence of disease recurrence.  Both axillae are benign.   LAB RESULTS:  CMP     Component Value Date/Time   NA 140 04/02/2019 1131   K 4.0 04/02/2019 1131   CL 105 04/02/2019 1131   CO2 24 04/02/2019 1131   GLUCOSE 125 (H) 04/02/2019 1131   BUN 15 04/02/2019 1131   CREATININE 0.96 04/02/2019 1131   CREATININE 0.90 07/23/2018 0953   CALCIUM 9.2 04/02/2019 1131   PROT 7.1 04/02/2019  1131   ALBUMIN 3.9 04/02/2019 1131   AST 24 04/02/2019 1131   AST 22 07/23/2018 0953   ALT 18 04/02/2019 1131   ALT 17 07/23/2018 0953   ALKPHOS 59 04/02/2019 1131   BILITOT 0.3 04/02/2019 1131   BILITOT 0.4 07/23/2018 0953   GFRNONAA >60 04/02/2019 1131   GFRNONAA >60 07/23/2018 0953   GFRAA >60 04/02/2019 1131   GFRAA >60 07/23/2018 0953    No results found for: TOTALPROTELP, ALBUMINELP, A1GS, A2GS, BETS, BETA2SER, GAMS, MSPIKE, SPEI  No results found for: KPAFRELGTCHN, LAMBDASER, [facility]  Lab Results  Component Value Date   WBC 6.7 04/02/2019   NEUTROABS 4.2 04/02/2019   HGB 12.8 04/02/2019   HCT 38.7 04/02/2019   MCV 94.2 04/02/2019   PLT 184 04/02/2019    '@LASTCHEMISTRY' @  No results found for: LABCA2  No components found for: SLHTDS287  No results for input(s): INR in the last 168 hours.  No results found for: LABCA2  No results found for: GOT157  No results found for: WIO035  No results found for: DHR416  No results found for: CA2729  No components found for: HGQUANT  No results  found for: CEA1 / No results found for: CEA1   No results found for: AFPTUMOR  No results found for: CHROMOGRNA  No results found for: PSA1  Appointment on 04/02/2019  Component Date Value Ref Range Status  . WBC 04/02/2019 6.7  4.0 - 10.5 K/uL Final  . RBC 04/02/2019 4.11  3.87 - 5.11 MIL/uL Final  . Hemoglobin 04/02/2019 12.8  12.0 - 15.0 g/dL Final  . HCT 04/02/2019 38.7  36.0 - 46.0 % Final  . MCV 04/02/2019 94.2  80.0 - 100.0 fL Final  . MCH 04/02/2019 31.1  26.0 - 34.0 pg Final  . MCHC 04/02/2019 33.1  30.0 - 36.0 g/dL Final  . RDW 04/02/2019 12.6  11.5 - 15.5 % Final  . Platelets 04/02/2019 184  150 - 400 K/uL Final  . nRBC 04/02/2019 0.0  0.0 - 0.2 % Final  . Neutrophils Relative % 04/02/2019 61  % Final  . Neutro Abs 04/02/2019 4.2  1.7 - 7.7 K/uL Final  . Lymphocytes Relative 04/02/2019 26  % Final  . Lymphs Abs 04/02/2019 1.8  0.7 - 4.0 K/uL Final  . Monocytes Relative 04/02/2019 9  % Final  . Monocytes Absolute 04/02/2019 0.6  0.1 - 1.0 K/uL Final  . Eosinophils Relative 04/02/2019 3  % Final  . Eosinophils Absolute 04/02/2019 0.2  0.0 - 0.5 K/uL Final  . Basophils Relative 04/02/2019 1  % Final  . Basophils Absolute 04/02/2019 0.0  0.0 - 0.1 K/uL Final  . Immature Granulocytes 04/02/2019 0  % Final  . Abs Immature Granulocytes 04/02/2019 0.01  0.00 - 0.07 K/uL Final   Performed at [facility], [place] [address], [place], [place] [address]  . Sodium 04/02/2019 140  135 - 145 mmol/L Final  . Potassium 04/02/2019 4.0  3.5 - 5.1 mmol/L Final  . Chloride 04/02/2019 105  98 - 111 mmol/L Final  . CO2 04/02/2019 24  22 - 32 mmol/L Final  . Glucose, Bld 04/02/2019 125* 70 - 99 mg/dL Final  . BUN 04/02/2019 15  8 - 23 mg/dL Final  . Creatinine, Ser 04/02/2019 0.96  0.44 - 1.00 mg/dL Final  . Calcium 04/02/2019 9.2  8.9 - 10.3 mg/dL Final  . Total Protein 04/02/2019 7.1  6.5 - 8.1 g/dL Final  . Albumin 04/02/2019 3.9  3.5 - 5.0 g/dL Final  . AST  04/02/2019 24  15 - 41 U/L Final  . ALT 04/02/2019 18  0 - 44 U/L Final  . Alkaline Phosphatase 04/02/2019 59  38 - 126 U/L Final  . Total Bilirubin 04/02/2019 0.3  0.3 - 1.2 mg/dL Final  . GFR calc non Af Amer 04/02/2019 >60  >60 mL/min Final  . GFR calc Af Amer 04/02/2019 >60  >60 mL/min Final  . Anion gap 04/02/2019 11  5 - 15 Final   Performed at [facility], [place] [address], [place], [place] [address]    (this displays the last labs from the last 3 days)  No results found for: TOTALPROTELP, ALBUMINELP, A1GS, A2GS, BETS, BETA2SER, GAMS, MSPIKE, SPEI (this displays SPEP labs)  No results found for: KPAFRELGTCHN, LAMBDASER, KAPLAMBRATIO (kappa/lambda light chains)  No results found for: HGBA, HGBA2QUANT, HGBFQUANT, HGBSQUAN (Hemoglobinopathy evaluation)   No results found for: LDH  No results found for: IRON, TIBC, IRONPCTSAT (Iron and TIBC)  No results found for: FERRITIN  Urinalysis No results found for: COLORURINE, APPEARANCEUR, LABSPEC, PHURINE, GLUCOSEU, HGBUR, BILIRUBINUR, KETONESUR, PROTEINUR, UROBILINOGEN, NITRITE, LEUKOCYTESUR   STUDIES: No results found.  ELIGIBLE FOR AVAILABLE RESEARCH PROTOCOL:no  ASSESSMENT: 63 y.o. [place], [place] woman status post right breast upper inner quadrant biopsy 11/21/2017 for a clinical TXN0 invasive lobular carcinoma, grade 2, estrogen and progesterone receptor positive, HER-2 not amplified, with an MIB-1 of 2%.  (a) right breast upper outer quadrant biopsy 12/18/2017 showed no malignancy  (b) left breast retroareolar biopsy 12/18/2017 showed an atypical papillary lesion  (c) left lumpectomy 01/08/2018 showed an intraductal papilloma with no malignancy  (1) right lumpectomy and sentinel lymph node sampling 01/08/2018 showed a pT1b pN0, stage IA invasive lobular carcinoma, grade 1, with negative margins  (a) 2 right axillary lymph nodes were removed  (2) adjuvant radiation 02/15/2018 - 03/15/2018  Site/dose: 1. Right Breast / 42.56 Gy in 16 fractions 2. Boost / 8 Gy in 4 fractions Total dose of 50.56 Gy.  (4) tamoxifen started 04/12/2018  (5) genetics testing through the breast GYN panel at in [place] found no pathogenic mutations  PLAN: Lisa Valenzuela is now a little over a year out from definitive surgery for her breast cancer with no evidence of disease recurrence.  This is favorable.  She is tolerating the tamoxifen well and the plan will be to continue that a total of 1 year.  I have encouraged her to exercise 45 minutes 5 times a week.  She is halfway there.  It will not be difficult for her to improve on her current exercise program  She will have her next mammography in February.  I will see her again March of next year  She knows to call for any other issues that may develop before then.     Nereida Schepp, Virgie Dad, MD  04/02/19 12:31 PM Medical Oncology and Hematology [facility] [address] [place], [place] [address] Tel. [phone]    Fax. [phone]   I, Wilburn Mylar, am acting as scribe for Dr. Virgie Dad. Franny Selvage.  I, Lurline Del MD, have reviewed the above documentation for accuracy and completeness, and I agree with the above.

## 2019-04-02 ENCOUNTER — Inpatient Hospital Stay: Payer: BC Managed Care – PPO | Attending: Oncology | Admitting: Oncology

## 2019-04-02 ENCOUNTER — Inpatient Hospital Stay: Payer: BC Managed Care – PPO

## 2019-04-02 ENCOUNTER — Other Ambulatory Visit: Payer: Self-pay

## 2019-04-02 VITALS — BP 109/70 | HR 71 | Temp 98.0°F | Resp 18 | Ht 62.5 in | Wt 148.0 lb

## 2019-04-02 DIAGNOSIS — Z79810 Long term (current) use of selective estrogen receptor modulators (SERMs): Secondary | ICD-10-CM

## 2019-04-02 DIAGNOSIS — M8580 Other specified disorders of bone density and structure, unspecified site: Secondary | ICD-10-CM

## 2019-04-02 DIAGNOSIS — C50211 Malignant neoplasm of upper-inner quadrant of right female breast: Secondary | ICD-10-CM | POA: Insufficient documentation

## 2019-04-02 DIAGNOSIS — Z170 Estrogen receptor positive status [ER+]: Secondary | ICD-10-CM | POA: Insufficient documentation

## 2019-04-02 LAB — CBC WITH DIFFERENTIAL/PLATELET
Abs Immature Granulocytes: 0.01 10*3/uL (ref 0.00–0.07)
Basophils Absolute: 0 10*3/uL (ref 0.0–0.1)
Basophils Relative: 1 %
Eosinophils Absolute: 0.2 10*3/uL (ref 0.0–0.5)
Eosinophils Relative: 3 %
HCT: 38.7 % (ref 36.0–46.0)
Hemoglobin: 12.8 g/dL (ref 12.0–15.0)
Immature Granulocytes: 0 %
Lymphocytes Relative: 26 %
Lymphs Abs: 1.8 10*3/uL (ref 0.7–4.0)
MCH: 31.1 pg (ref 26.0–34.0)
MCHC: 33.1 g/dL (ref 30.0–36.0)
MCV: 94.2 fL (ref 80.0–100.0)
Monocytes Absolute: 0.6 10*3/uL (ref 0.1–1.0)
Monocytes Relative: 9 %
Neutro Abs: 4.2 10*3/uL (ref 1.7–7.7)
Neutrophils Relative %: 61 %
Platelets: 184 10*3/uL (ref 150–400)
RBC: 4.11 MIL/uL (ref 3.87–5.11)
RDW: 12.6 % (ref 11.5–15.5)
WBC: 6.7 10*3/uL (ref 4.0–10.5)
nRBC: 0 % (ref 0.0–0.2)

## 2019-04-02 LAB — COMPREHENSIVE METABOLIC PANEL
ALT: 18 U/L (ref 0–44)
AST: 24 U/L (ref 15–41)
Albumin: 3.9 g/dL (ref 3.5–5.0)
Alkaline Phosphatase: 59 U/L (ref 38–126)
Anion gap: 11 (ref 5–15)
BUN: 15 mg/dL (ref 8–23)
CO2: 24 mmol/L (ref 22–32)
Calcium: 9.2 mg/dL (ref 8.9–10.3)
Chloride: 105 mmol/L (ref 98–111)
Creatinine, Ser: 0.96 mg/dL (ref 0.44–1.00)
GFR calc Af Amer: >60 mL/min (ref >60)
GFR calc non Af Amer: >60 mL/min (ref >60)
Glucose, Bld: 125 mg/dL — ABNORMAL HIGH (ref 70–99)
Potassium: 4 mmol/L (ref 3.5–5.1)
Sodium: 140 mmol/L (ref 135–145)
Total Bilirubin: 0.3 mg/dL (ref 0.3–1.2)
Total Protein: 7.1 g/dL (ref 6.5–8.1)

## 2019-04-15 ENCOUNTER — Other Ambulatory Visit: Payer: Self-pay | Admitting: Oncology

## 2019-04-30 DIAGNOSIS — Z03818 Encounter for observation for suspected exposure to other biological agents ruled out: Secondary | ICD-10-CM | POA: Diagnosis not present

## 2019-05-06 DIAGNOSIS — Z03818 Encounter for observation for suspected exposure to other biological agents ruled out: Secondary | ICD-10-CM | POA: Diagnosis not present

## 2019-05-07 DIAGNOSIS — U071 COVID-19: Secondary | ICD-10-CM | POA: Diagnosis not present

## 2019-05-08 ENCOUNTER — Other Ambulatory Visit: Payer: Self-pay

## 2019-05-08 DIAGNOSIS — Z20822 Contact with and (suspected) exposure to covid-19: Secondary | ICD-10-CM

## 2019-05-08 DIAGNOSIS — R6889 Other general symptoms and signs: Secondary | ICD-10-CM | POA: Diagnosis not present

## 2019-05-12 LAB — NOVEL CORONAVIRUS, NAA: SARS-CoV-2, NAA: NOT DETECTED

## 2019-07-13 IMAGING — MG MM CLIP PLACEMENT
9 of 10 series · 9 of 10 positions shown · non-contrast
Comparison: Previous exam(s).

CLINICAL DATA: Biopsy proven right breast invasive mammary
carcinoma and mammary carcinoma in-situ in the upper-inner quadrant
of the right breast. Enhancement was seen 3 cm anterior to the
biopsy cavity. MRI guided core biopsy of anterior enhancement
recommended. Indeterminate nodule in the 6 o'clock retroareolar
region of the left breast.

EXAM:
DIAGNOSTIC BILATERAL MAMMOGRAM POST MRI BIOPSIES

[R CC (1 of 6)]
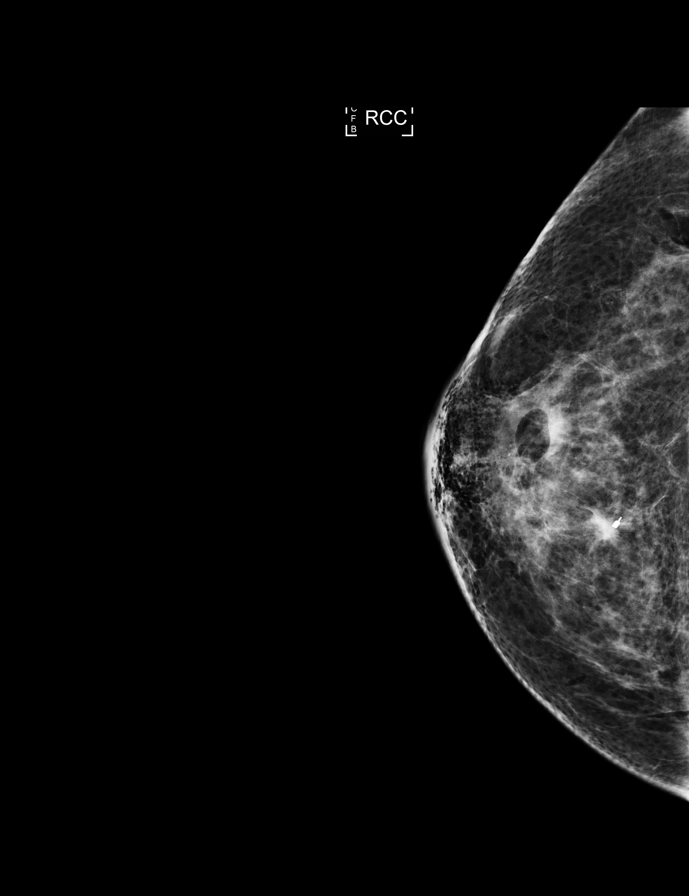

[R LM (1 of 2)]
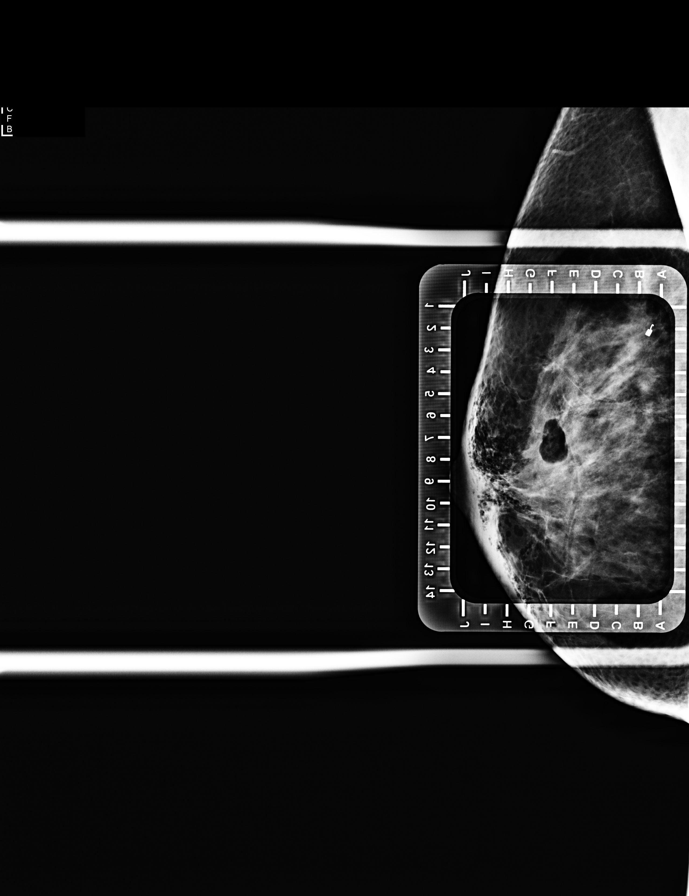

[R CC (2 of 6)]
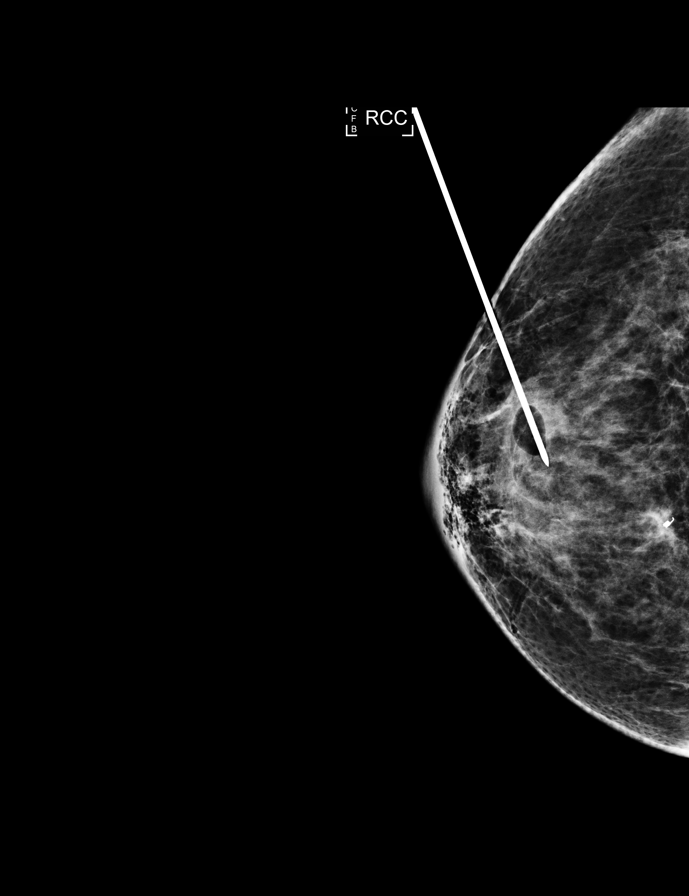

[R CC (3 of 6)]
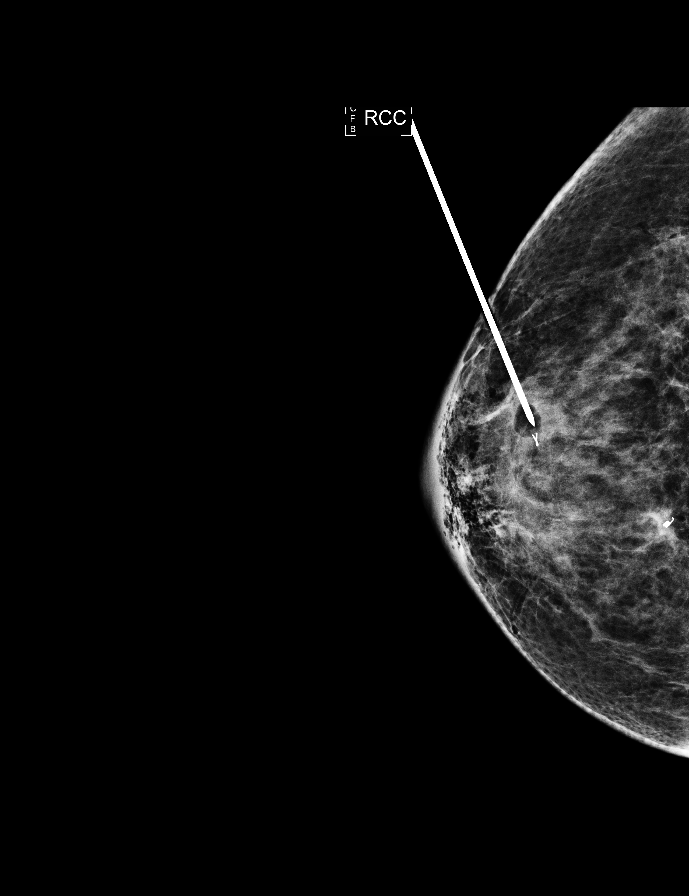

[R CC (4 of 6)]
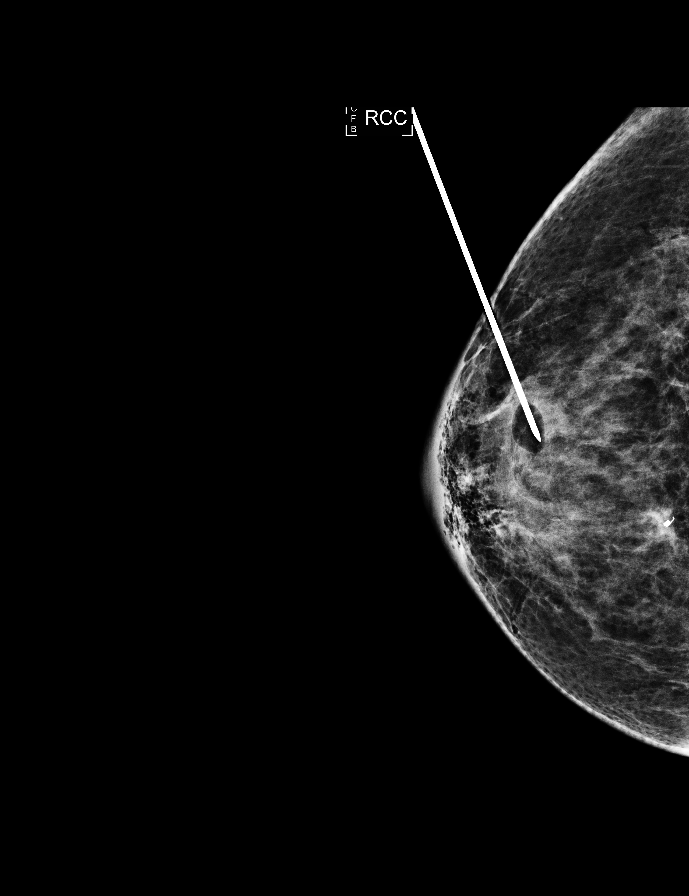

[R CC (5 of 6)]
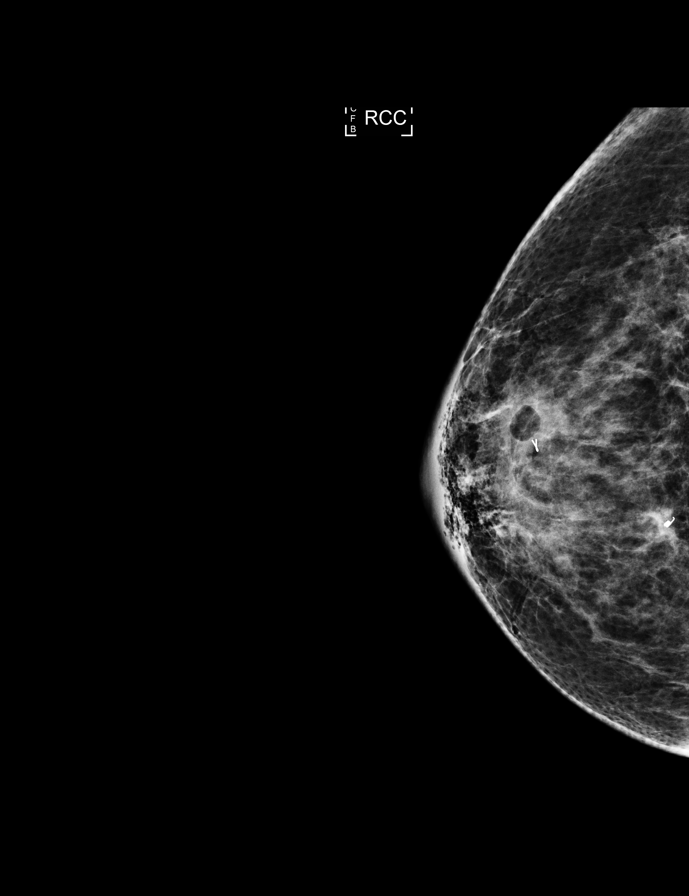

[R CC (6 of 6)]
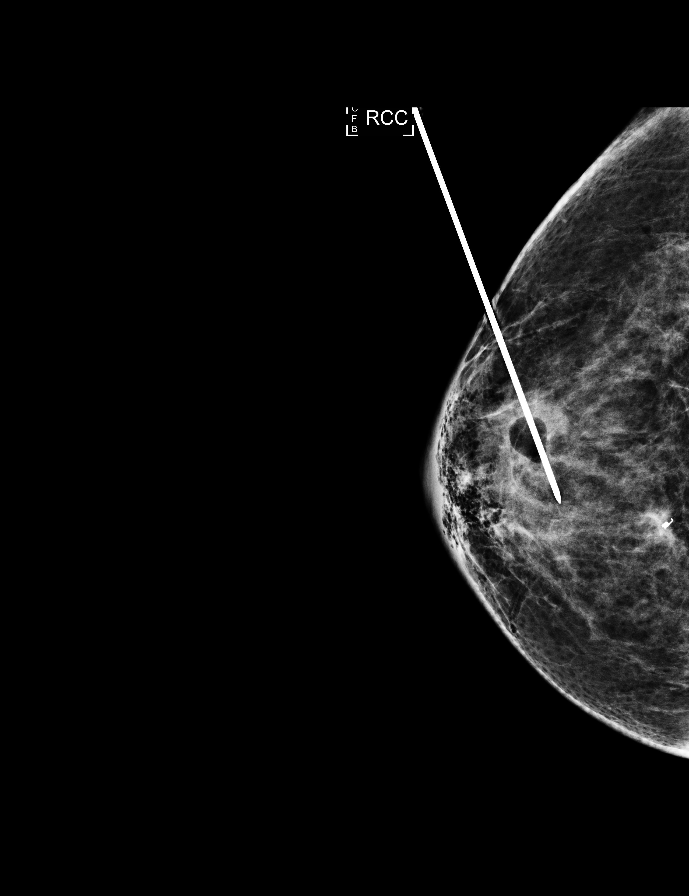

[R LM (2 of 2)]
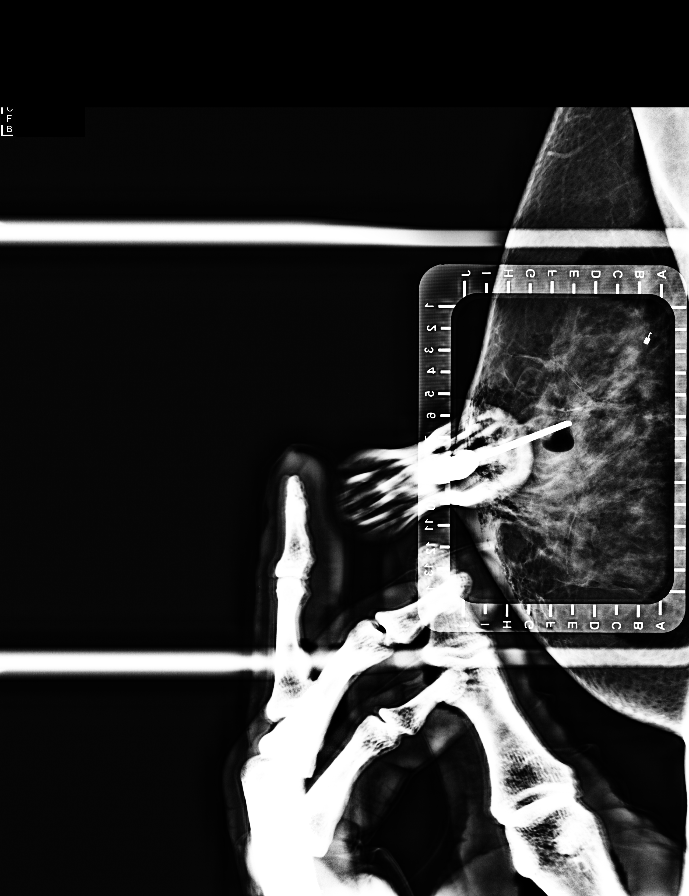

[R ML]
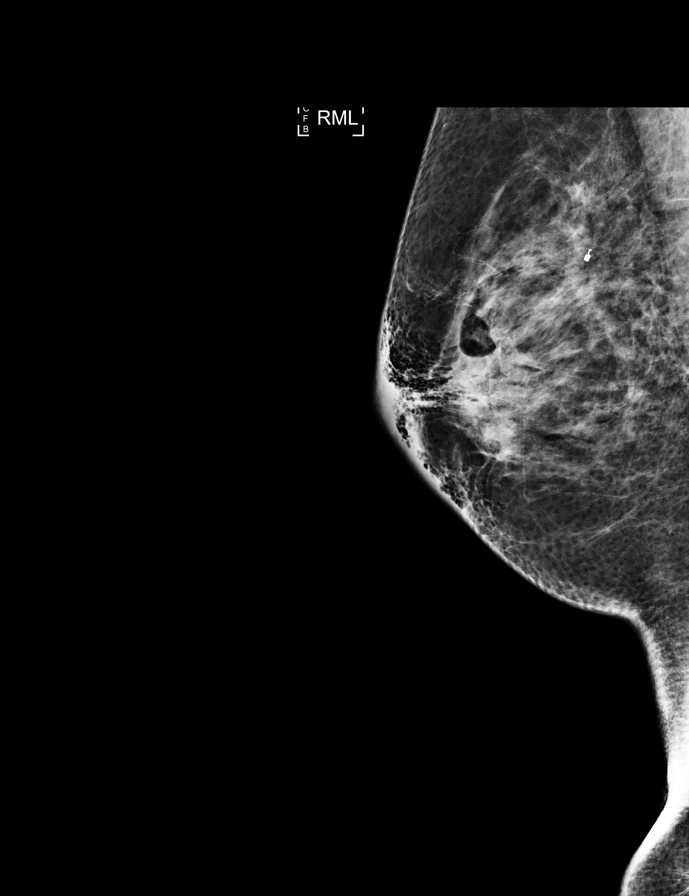

[9 of 10 positions shown; findings below may reference images not displayed]

FINDINGS: Mammographic images were obtained following MR guided biopsies of
both breast. Mammographic images of the right breast show the biopsy
cavity in the upper slightly outer quadrant of the right breast. The
MR guided clip did not deploy. A ribbon shaped tissue marker clip
was placed into the biopsy cavity using mammographic guidance. It is
located 3.6 cm anterior and lateral to the biopsy proven invasive
mammary carcinoma/mammary carcinoma in-situ (coil shaped clip).
Mammographic images of the left breast show a dumbbell-shaped tissue
marker clip in retroareolar region of the left breast in appropriate
position.
IMPRESSION: Status post MR guided core biopsies of both breast with pathology
pending.

Final Assessment: Post Procedure Mammograms for Marker Placement

## 2019-07-13 IMAGING — MR MR BREAST BIOPSY
6 of 8 series · 34 of 48 positions shown · IV contrast (13ml Multihance)
Comparison: Previous exams.

ADDENDUM:
Pathology revealed USUAL DUCTAL HYPERPLASIA, FIBROCYSTIC CHANGES
INCLUDING APOCRINE METAPLASIA of the Right breast, upper outer
quadrant. This was found to be concordant by Dr. Mariem Mariouma Ule.
Pathology revealed ATYPICAL PAPILLARY LESION(S) of the Left breast,
retroareolar. The biopsy has multiple papillary lesions. This
finding in concerning for ductal carcinoma in situ arising within a
papillary lesion. This was found to be concordant by Dr. Mariem Mariouma Ule,
with excision recommended. Pathology results were discussed with the
patient by telephone. The patient reported doing well after the
biopsies with tenderness at the sites. Post biopsy instructions and
care were reviewed and questions were answered. The patient was
encouraged to call The [REDACTED] for any
additional concerns. The patient has a recent diagnosis of right
breast cancer and should follow her outlined treatment plan.

Pathology results reported by B Vjollca Brazhdi, RN on 12/20/2017.
CLINICAL DATA: Biopsy proven invasive mammary carcinoma and mammary
carcinoma in-situ in the upper inner quadrant of the right breast.
Recent MRI showed enhancement extending 3 cm anterior to the
biopsied mass. MR guided core biopsy of the anterior enhancement
recommended. 6 mm indeterminate enhancing mass seen in the 6 o'clock
retroareolar region of the left breast. The lesion was not seen with
ultrasound therefore MRI guided core biopsy recommended.
EXAM:
MRI GUIDED CORE NEEDLE BIOPSIES OF BOTH BREAST.
TECHNIQUE: Multiplanar, multisequence MR imaging of the both breast was
performed both before and after administration of intravenous
contrast.
CONTRAST:  13mL MULTIHANCE GADOBENATE DIMEGLUMINE 529 MG/ML IV SOLN

[Series 2: fiducial bilateral · sagittal · 2.0mm · 1.33mm/px · 6 of 144 slices shown]
[im 1/144]
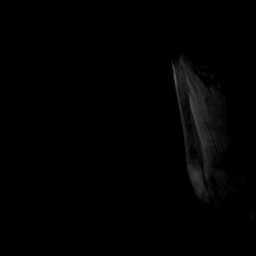
[im 29/144]
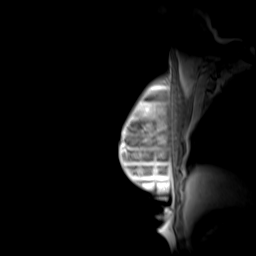
[im 58/144]
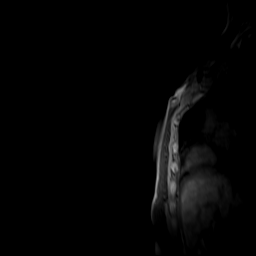
[im 86/144]
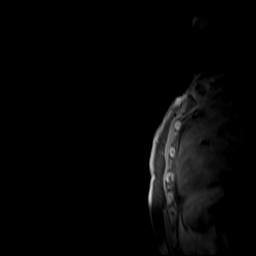
[im 115/144]
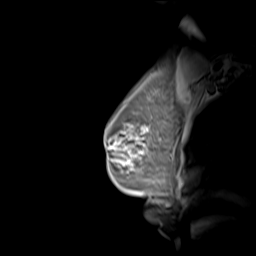
[im 144/144]
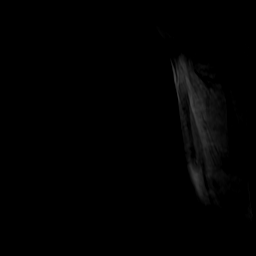

[Series 3: dynamic pre · axial · non-contrast · 1.3mm · 0.73mm/px · z∈[-95,+112]mm · 6 of 160 slices shown]
[im 1/160]
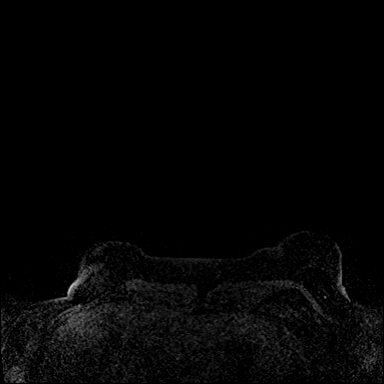
[im 32/160]
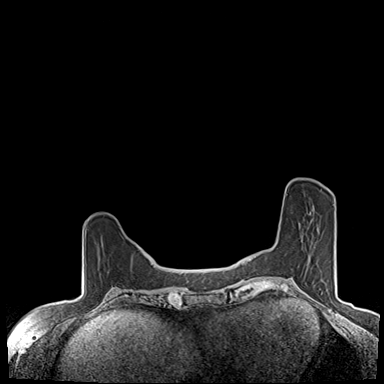
[im 64/160]
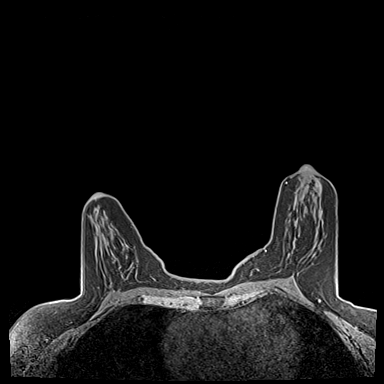
[im 96/160]
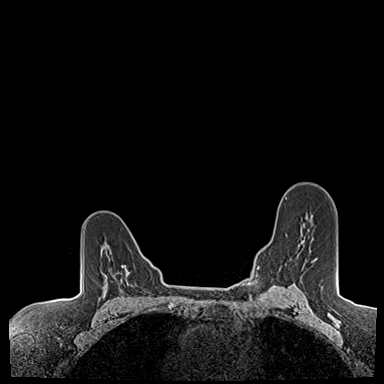
[im 128/160]
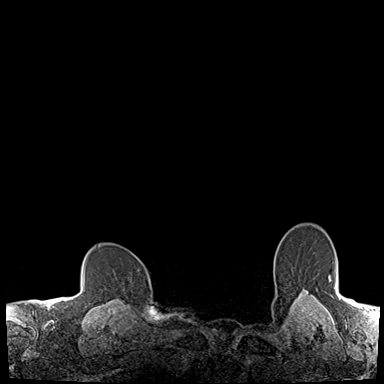
[im 160/160]
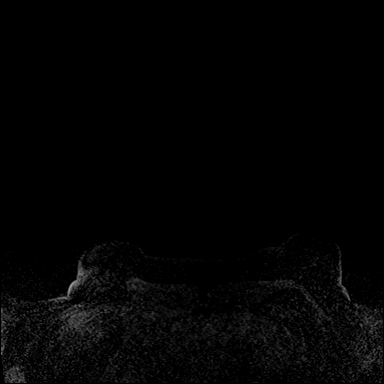

[Series 4: dynamic post 20 · axial · 1.3mm · 0.73mm/px · z∈[-95,+112]mm · 6 of 160 slices shown]
[im 1/160]
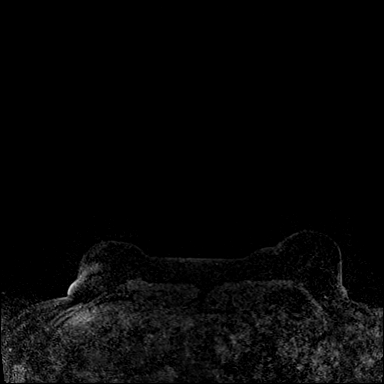
[im 32/160]
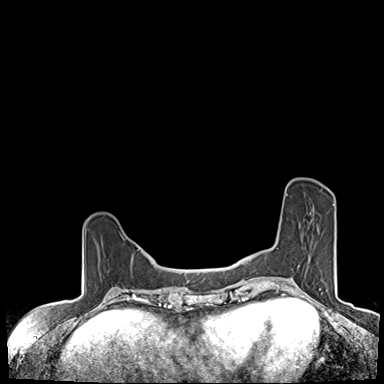
[im 64/160]
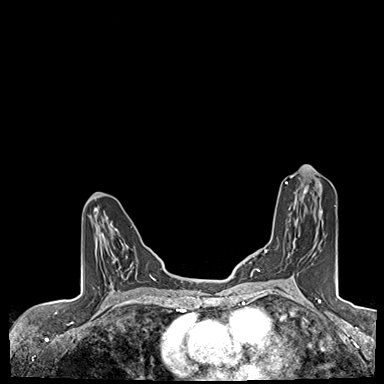
[im 96/160]
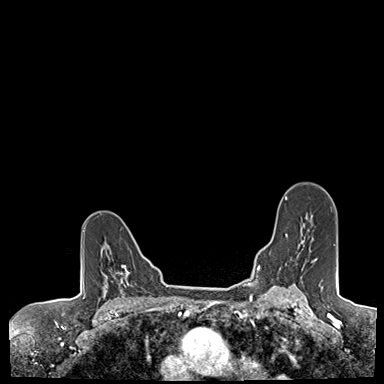
[im 128/160]
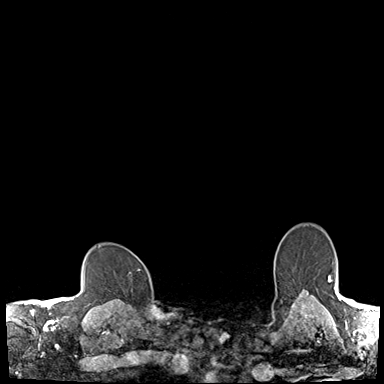
[im 160/160]
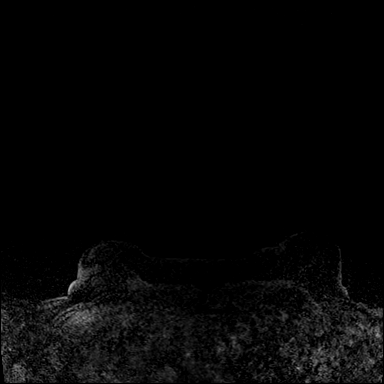

[Series 7: dynamic post 3 · axial · 1.3mm · 0.73mm/px · z∈[-95,+112]mm · 6 of 160 slices shown]
[im 1/160]
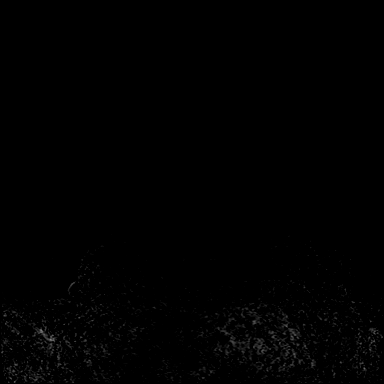
[im 32/160]
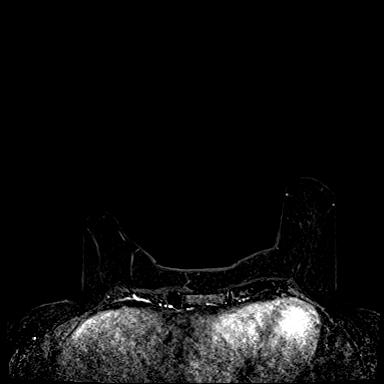
[im 64/160]
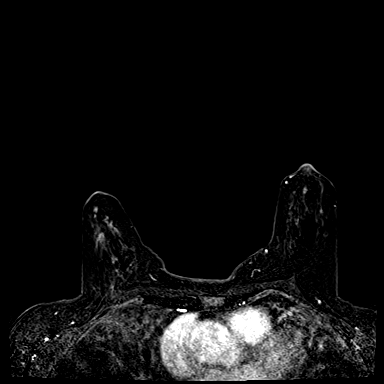
[im 96/160]
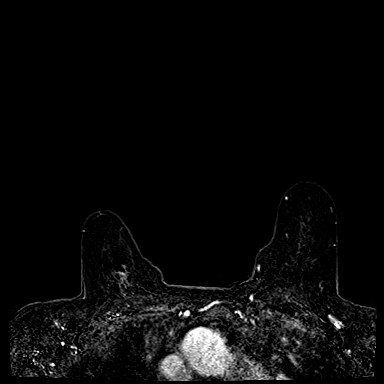
[im 128/160]
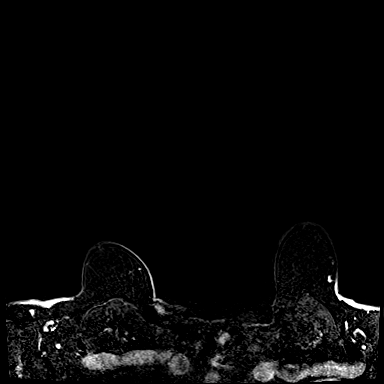
[im 160/160]
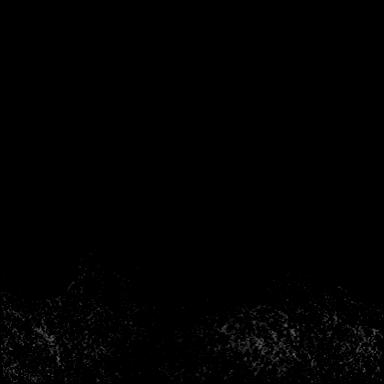

[Series 8: needle confirmation · axial · 1.3mm · 0.73mm/px · z∈[-95,+112]mm · 6 of 160 slices shown]
[im 1/160]
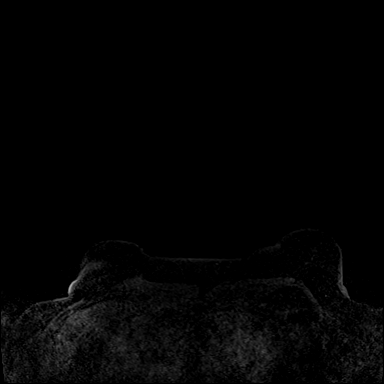
[im 32/160]
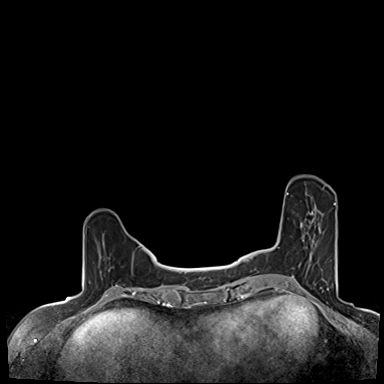
[im 64/160]
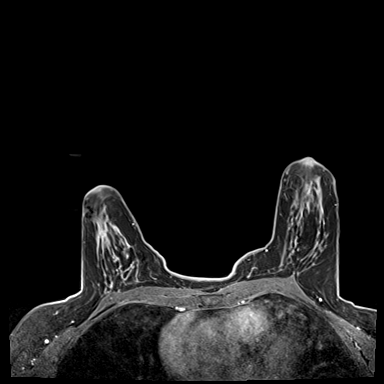
[im 96/160]
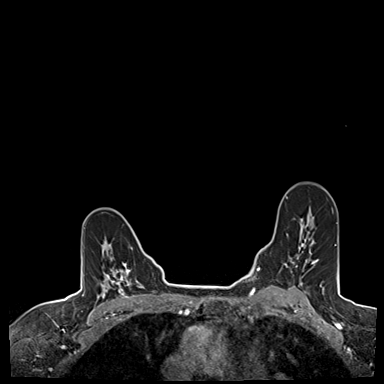
[im 128/160]
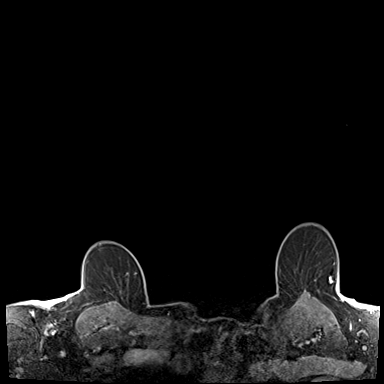
[im 160/160]
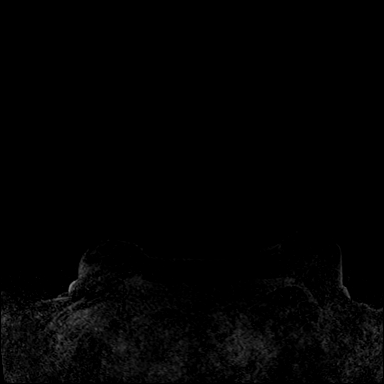

[Series 9: needle confirmation_sub · axial · 1.3mm · 0.73mm/px · z∈[-95,+28]mm · 4 of 160 slices shown]
[im 1/160]
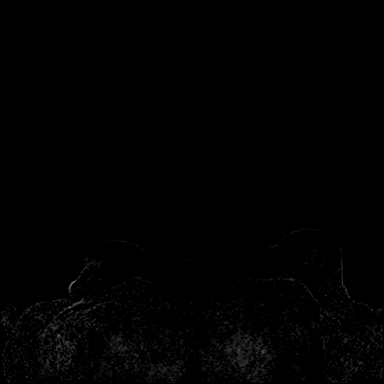
[im 32/160]
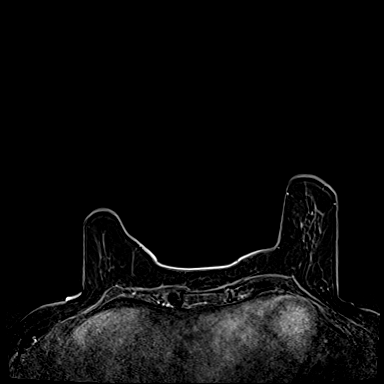
[im 64/160]
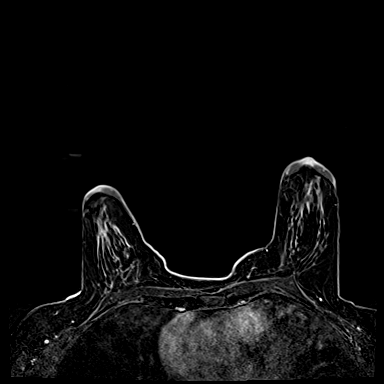
[im 96/160]
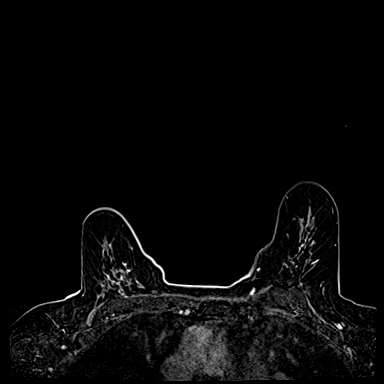

[34 of 48 positions shown; findings below may reference images not displayed]

FINDINGS: I met with the patient, and we discussed the procedure of MRI guided
biopsies, including risks, benefits, and alternatives. Specifically,
we discussed the risks of infection, bleeding, tissue injury, clip
migration, and inadequate sampling. Informed, written consent was
given. The usual time out protocol was performed immediately prior
to the procedure.

Using sterile technique, 1% Lidocaine and 1% Lidocaine with
epinephrine, MRI guidance, and a 9 gauge vacuum assisted device,
biopsy was performed of enhancement extending anterior to the
biopsied mass in the right breast using a lateral to medial
approach. At the conclusion of the procedure, a tissue marker clip
was deployed into the biopsy cavity. The tissue marker clip did not
deploy therefore a ribbon shaped biopsy clip was placed into the
biopsy cavity mammographically.

Using sterile technique, 1% Lidocaine, MRI guidance, and a 9 gauge
vacuum assisted device, biopsy was performed of a nodule in the
retroareolar region of the right breast using a lateral to medial
approach. At the conclusion of the procedure, a dumbbell-shaped
tissue marker clip was deployed into the biopsy cavity. Follow-up
2-view mammogram was performed and dictated separately.
IMPRESSION: MRI guided biopsy of both breast.  No apparent complications.

## 2019-10-08 ENCOUNTER — Other Ambulatory Visit: Payer: Self-pay | Admitting: Oncology

## 2019-10-08 DIAGNOSIS — Z9889 Other specified postprocedural states: Secondary | ICD-10-CM

## 2019-11-20 ENCOUNTER — Other Ambulatory Visit: Payer: Self-pay

## 2019-11-20 NOTE — Telephone Encounter (Signed)
Medication refill request: Minocycline 50 mg capsule  Last AEX:  04/01/19 Next AEX: 04/16/20 Last MMG (if hormonal medication request): 11/19/18 Bi-rads 2 benign  Refill authorized: #30 with [facility] requesting capsules instead of tabs

## 2019-11-21 MED ORDER — MINOCYCLINE HCL 50 MG PO CAPS: ORAL_CAPSULE | ORAL | 2 refills | Status: DC

## 2019-11-26 ENCOUNTER — Ambulatory Visit: Payer: 59 | Attending: Internal Medicine

## 2019-11-26 DIAGNOSIS — Z23 Encounter for immunization: Secondary | ICD-10-CM

## 2019-11-26 NOTE — Progress Notes (Signed)
   Covid-19 Vaccination Clinic  Name:  Lisa Valenzuela    MRN: [id number] DOB: December 16, 1955  11/26/2019  [name] was observed post Covid-19 immunization for 15 minutes without incidence. She was provided with Vaccine Information Sheet and instruction to access the V-Safe system.   [name] was instructed to call 911 with any severe reactions post vaccine: Marland Kitchen Difficulty breathing  . Swelling of your face and throat  . A fast heartbeat  . A bad rash all over your body  . Dizziness and weakness    Immunizations Administered    Name Date Dose VIS Date Route   Pfizer COVID-19 Vaccine 11/26/2019 11:58 AM 0.3 mL 09/27/2019 Intramuscular   Manufacturer: [place]   Lot: [id number]   [place]: [id number]

## 2019-11-28 ENCOUNTER — Ambulatory Visit: Payer: Self-pay

## 2019-12-04 ENCOUNTER — Other Ambulatory Visit: Payer: Self-pay

## 2019-12-04 ENCOUNTER — Ambulatory Visit
Admission: RE | Admit: 2019-12-04 | Discharge: 2019-12-04 | Disposition: A | Discharge status: Home / Self Care | Payer: 59 | Source: Ambulatory Visit | Attending: Oncology | Admitting: Oncology

## 2019-12-04 DIAGNOSIS — Z9889 Other specified postprocedural states: Secondary | ICD-10-CM

## 2019-12-21 ENCOUNTER — Ambulatory Visit: Payer: 59 | Attending: Internal Medicine

## 2019-12-21 DIAGNOSIS — Z23 Encounter for immunization: Secondary | ICD-10-CM | POA: Insufficient documentation

## 2019-12-21 NOTE — Progress Notes (Signed)
   Covid-19 Vaccination Clinic  Name:  TYSHAY CAPANNA    MRN: [id number] DOB: [birth year]  12/21/2019  Ms. Magner was observed post Covid-19 immunization for 15 minutes without incident. She was provided with Vaccine Information Sheet and instruction to access the V-Safe system.   Ms. Kellog was instructed to call 911 with any severe reactions post vaccine: Marland Kitchen Difficulty breathing  . Swelling of face and throat  . A fast heartbeat  . A bad rash all over body  . Dizziness and weakness   Immunizations Administered    Name Date Dose VIS Date Route   Pfizer COVID-19 Vaccine 12/21/2019  8:14 AM 0.3 mL 09/27/2019 Intramuscular   Manufacturer: [place]   Lot: [id number]   [place]: [id number]

## 2020-01-01 NOTE — Progress Notes (Signed)
[place]  Telephone:([phone] Fax:(336) [phone]     ID: Lisa Valenzuela DOB: 04-10-1956  MR#: [id number]  TSV#:[id number]  Patient Care Team: Fanny Bien, MD as PCP - General (Family Medicine) Anahid Eskelson, Virgie Dad, MD as Consulting Physician (Oncology) Alphonsa Overall, MD as Consulting Physician (General Surgery) Kyung Rudd, MD as Consulting Physician (Radiation Oncology) Delice Bison, Charlestine Massed, NP as Nurse Practitioner (Hematology and Oncology) Megan Salon, MD as Consulting Physician (Gynecology) OTHER MD:  CHIEF COMPLAINT: Estrogen receptor positive lobular breast cancer  CURRENT TREATMENT: Tamoxifen   INTERVAL HISTORY: Lisa Valenzuela returns today for follow-up of her estrogen receptor positive breast cancer.  She continues on tamoxifen.  She continues to tolerate this well.  She says hot flashes are "not bad".  She has some minimal to no vaginal discharge.  Since her last visit, she underwent bilateral diagnostic mammography with tomography at [facility] on 12/04/2019 showing: breast density category C; no evidence of malignancy in either breast.   She has received both Covid-19 vaccine doses, on 11/26/2019 and 12/21/2019.   REVIEW OF SYSTEMS: Lisa Valenzuela continues to work full-time and of course this is the beginning of tax season.  She has rosacea for which she takes -7.  This is only partially successful.  For some reason when she went to get her tamoxifen refilled where before she paid nothing now she has to pay $40 a month which does seem to be excessive.  She is not exercising regularly at present.  A detailed review of systems was otherwise stable.   HISTORY OF CURRENT ILLNESS: From the original intake note:  Lisa Valenzuela had routine screening mammography on 11/14/2017 showing a possible abnormality in her bilateral breasts. She underwent a bilateral diagnostic mammography with tomography and bilateral breast ultrasonography at [facility] on  11/17/2017 showing: breast density category C.  The left breast mass was evaluated and proved to be a benign cyst.  On the right however there was an area of architectural distortion, which could not be well delineated.  It was negative on ultrasonography.  The right axilla also was benign on ultrasound.  Accordingly on 11/21/2017 the patient proceeded to biopsy of the right breast area in question. 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 E-cadherin negative. The base of tumor is HER-2 negative and the proliferation marker Ki67 is 2%.   The patient's subsequent history is as detailed below.   PAST MEDICAL HISTORY: Past Medical History:  Diagnosis Date  . Breast cancer ([place])   . Family history of breast cancer   . Genetic testing 12/25/2017   Breast/GYN panel (23 genes) @ [facility] - No pathogenic mutations detected  . Personal history of radiation therapy   . Vitamin D deficiency     PAST SURGICAL HISTORY: Past Surgical History:  Procedure Laterality Date  . BREAST EXCISIONAL BIOPSY Left 2019  . BREAST LUMPECTOMY Right 2019  . BREAST LUMPECTOMY WITH RADIOACTIVE SEED AND SENTINEL LYMPH NODE BIOPSY Bilateral 01/08/2018   Procedure: BILATERAL BREAST LUMPECTOMY WITH BILATERAL  RADIOACTIVE SEEDS AND RIGHT  SENTINEL LYMPH NODE BIOPSY;  Surgeon: Alphonsa Overall, MD;  Location: [facility];  Service: General;  Laterality: Bilateral;  . BREAST SURGERY     bilateral breast bx    FAMILY HISTORY Family History  Problem Relation Age of Onset  . Breast cancer Mother 43  currently 82  . Prostate cancer Father 75       currently 78  . Breast cancer Sister 94       currently 74  . Colon cancer Paternal Uncle        dx 23s; deceased 49s  . Bladder Cancer Maternal Grandmother        dx 43s; deceased 45s  The  patient's father, 15, and mother, 42 are still alive. Her father was diagnosed with prostate cancer at 19 years old. Her mother was diagnosed with breast cancer at 64 years old. The patient has two brothers and two sisters. One of her sisters was diagnoses with breast cancer at 64 years old.    GYNECOLOGIC HISTORY:  No LMP recorded. Patient is postmenopausal. Menarche: 65 years old Age at first live birth: 64 years old [employer] P 2 LMP 2009 Contraceptive yes, 1980-2000 HRT no  Hysterectomy? no SO? no   SOCIAL HISTORY:  Daysie works as an Counselling psychologist. Her husband, Kasandra Knudsen is a Theme park manager at [employer].  They have one daughter, [name], 29, who lives in [place], [place], and is a middle school Associate Professor. They also have one son, [name], 38, who lives in [place], [place], and is a Physicist, medical. The patient has two grandchildren.     ADVANCED DIRECTIVES: In the absence of any documentation to the contrary, the patient's spouse is their HCPOA.    HEALTH MAINTENANCE: Social History   Tobacco Use  . Smoking status: Never Smoker  . Smokeless tobacco: Never Used  Substance Use Topics  . Alcohol use: No  . Drug use: No     Colonoscopy:  PAP:  Bone density: 07/05/2018 showing a T score of -1.3.   No Known Allergies  Current Outpatient Medications  Medication Sig Dispense Refill  . Biotin w/ Vitamins C & E (HAIR/SKIN/NAILS PO) Take 1 tablet by mouth daily.    . calcium-vitamin D (OSCAL WITH D) 500-200 MG-UNIT tablet Take 1 tablet by mouth.    . Cholecalciferol (VITAMIN D3 PO) Take 1 tablet by mouth daily.    . metroNIDAZOLE (METROGEL) 1 % gel Apply topically daily. 45 g 3  . minocycline (MINOCIN) 50 MG capsule 1 capsule bid as needed for rosacea flare 30 capsule 2  . Multiple Vitamin (MULTIVITAMIN WITH MINERALS) TABS tablet Take 1 tablet by mouth daily. Centrum Women's 50+    . tamoxifen (NOLVADEX) 20 MG tablet Take 1 tablet (20 mg total) by mouth daily. 90  tablet 4   No current facility-administered medications for this visit.    OBJECTIVE: Middle-aged white woman who appears younger than stated age  95:   01/02/20 0933  BP: 127/81  Pulse: 76  Resp: 18  Temp: 98.2 F (36.8 C)  SpO2: 99%     Body mass index is 26.6 kg/m.   Wt Readings from Last 3 Encounters:  01/02/20 147 lb 12.8 oz (67 kg)  04/02/19 148 lb (67.1 kg)  04/01/19 146 lb 3.2 oz (66.3 kg)      ECOG FS:0 - Asymptomatic  Sclerae unicteric, EOMs intact Wearing a mask No cervical or supraclavicular adenopathy Lungs no rales or rhonchi Heart regular rate and rhythm Abd soft, nontender, positive bowel sounds MSK no focal spinal tenderness, no upper extremity lymphedema Neuro: nonfocal, well oriented, appropriate affect Breasts: Status post bilateral lumpectomies.  There is no evidence of disease recurrence bilaterally.  Both axillae are benign   LAB RESULTS:  CMP     Component Value Date/Time  NA 142 01/02/2020 0920   K 4.1 01/02/2020 0920   CL 106 01/02/2020 0920   CO2 27 01/02/2020 0920   GLUCOSE 108 (H) 01/02/2020 0920   BUN 14 01/02/2020 0920   CREATININE 0.94 01/02/2020 0920   CREATININE 0.90 07/23/2018 0953   CALCIUM 9.4 01/02/2020 0920   PROT 7.2 01/02/2020 0920   ALBUMIN 4.0 01/02/2020 0920   AST 22 01/02/2020 0920   AST 22 07/23/2018 0953   ALT 17 01/02/2020 0920   ALT 17 07/23/2018 0953   ALKPHOS 59 01/02/2020 0920   BILITOT 0.4 01/02/2020 0920   BILITOT 0.4 07/23/2018 0953   GFRNONAA >60 01/02/2020 0920   GFRNONAA >60 07/23/2018 0953   GFRAA >60 01/02/2020 0920   GFRAA >60 07/23/2018 0953    No results found for: TOTALPROTELP, ALBUMINELP, A1GS, A2GS, BETS, BETA2SER, GAMS, MSPIKE, SPEI  No results found for: KPAFRELGTCHN, LAMBDASER, KAPLAMBRATIO  Lab Results  Component Value Date   WBC 6.2 01/02/2020   NEUTROABS 3.7 01/02/2020   HGB 13.8 01/02/2020   HCT 41.0 01/02/2020   MCV 94.5 01/02/2020   PLT 224 01/02/2020   No  results found for: LABCA2  No components found for: [id number]  No results for input(s): INR in the last 168 hours.  No results found for: LABCA2  No results found for: ATF573  No results found for: UKG254  No results found for: YHC623  No results found for: CA2729  No components found for: HGQUANT  No results found for: CEA1 / No results found for: CEA1   No results found for: AFPTUMOR  No results found for: CHROMOGRNA  No results found for: PSA1  Appointment on 01/02/2020  Component Date Value Ref Range Status  . Sodium 01/02/2020 142  135 - 145 mmol/L Final  . Potassium 01/02/2020 4.1  3.5 - 5.1 mmol/L Final  . Chloride 01/02/2020 106  98 - 111 mmol/L Final  . CO2 01/02/2020 27  22 - 32 mmol/L Final  . Glucose, Bld 01/02/2020 108* 70 - 99 mg/dL Final   Glucose reference range applies only to samples taken after fasting for at least 8 hours.  . BUN 01/02/2020 14  8 - 23 mg/dL Final  . Creatinine, Ser 01/02/2020 0.94  0.44 - 1.00 mg/dL Final  . Calcium 01/02/2020 9.4  8.9 - 10.3 mg/dL Final  . Total Protein 01/02/2020 7.2  6.5 - 8.1 g/dL Final  . Albumin 01/02/2020 4.0  3.5 - 5.0 g/dL Final  . AST 01/02/2020 22  15 - 41 U/L Final  . ALT 01/02/2020 17  0 - 44 U/L Final  . Alkaline Phosphatase 01/02/2020 59  38 - 126 U/L Final  . Total Bilirubin 01/02/2020 0.4  0.3 - 1.2 mg/dL Final  . GFR calc non Af Amer 01/02/2020 >60  >60 mL/min Final  . GFR calc Af Amer 01/02/2020 >60  >60 mL/min Final  . Anion gap 01/02/2020 9  5 - 15 Final   Performed at [facility], [place] [address], [place], [place] [address]  . WBC 01/02/2020 6.2  4.0 - 10.5 K/uL Final  . RBC 01/02/2020 4.34  3.87 - 5.11 MIL/uL Final  . Hemoglobin 01/02/2020 13.8  12.0 - 15.0 g/dL Final  . HCT 01/02/2020 41.0  36.0 - 46.0 % Final  . MCV 01/02/2020 94.5  80.0 - 100.0 fL Final  . MCH 01/02/2020 31.8  26.0 - 34.0 pg Final  . MCHC 01/02/2020 33.7  30.0 - 36.0 g/dL Final  .  RDW  01/02/2020 12.4  11.5 - 15.5 % Final  . Platelets 01/02/2020 224  150 - 400 K/uL Final  . nRBC 01/02/2020 0.0  0.0 - 0.2 % Final  . Neutrophils Relative % 01/02/2020 59  % Final  . Neutro Abs 01/02/2020 3.7  1.7 - 7.7 K/uL Final  . Lymphocytes Relative 01/02/2020 27  % Final  . Lymphs Abs 01/02/2020 1.6  0.7 - 4.0 K/uL Final  . Monocytes Relative 01/02/2020 9  % Final  . Monocytes Absolute 01/02/2020 0.6  0.1 - 1.0 K/uL Final  . Eosinophils Relative 01/02/2020 4  % Final  . Eosinophils Absolute 01/02/2020 0.2  0.0 - 0.5 K/uL Final  . Basophils Relative 01/02/2020 1  % Final  . Basophils Absolute 01/02/2020 0.0  0.0 - 0.1 K/uL Final  . Immature Granulocytes 01/02/2020 0  % Final  . Abs Immature Granulocytes 01/02/2020 0.02  0.00 - 0.07 K/uL Final   Performed at [facility], [place] [address], [place], [place] [address]    (this displays the last labs from the last 3 days)  No results found for: TOTALPROTELP, ALBUMINELP, A1GS, A2GS, BETS, BETA2SER, GAMS, MSPIKE, SPEI (this displays SPEP labs)  No results found for: KPAFRELGTCHN, LAMBDASER, KAPLAMBRATIO (kappa/lambda light chains)  No results found for: HGBA, HGBA2QUANT, HGBFQUANT, HGBSQUAN (Hemoglobinopathy evaluation)   No results found for: LDH  No results found for: IRON, TIBC, IRONPCTSAT (Iron and TIBC)  No results found for: FERRITIN  Urinalysis No results found for: COLORURINE, APPEARANCEUR, LABSPEC, PHURINE, GLUCOSEU, HGBUR, BILIRUBINUR, KETONESUR, PROTEINUR, UROBILINOGEN, NITRITE, LEUKOCYTESUR   STUDIES: MM DIAG BREAST TOMO BILATERAL  Result Date: 12/04/2019 CLINICAL DATA:  64 year old patient presents for routine annual mammogram. She has a history of right breast cancer status post lumpectomy in 2019. History of left breast excisional biopsy. EXAM: DIGITAL DIAGNOSTIC BILATERAL MAMMOGRAM WITH CAD AND TOMO COMPARISON:  Previous exam(s). ACR Breast Density Category c: The breast tissue is  heterogeneously dense, which may obscure small masses. FINDINGS: Stable lumpectomy changes in the superior right breast. Postsurgical changes retroareolar left breast. No mass, nonsurgical distortion, or suspicious microcalcification is identified in either breast to suggest malignancy. Mammographic images were processed with CAD. IMPRESSION: No mammographic evidence of malignancy in either breast. Lumpectomy changes on the right. RECOMMENDATION: Diagnostic mammogram is suggested in 1 year. (Code:[id number]-01Y) I have discussed the findings and recommendations with the patient. If applicable, a reminder letter will be sent to the patient regarding the next appointment. BI-RADS CATEGORY  2: Benign. Electronically Signed   By: Curlene Dolphin M.D.   On: 12/04/2019 16:17    ELIGIBLE FOR AVAILABLE RESEARCH PROTOCOL:no  ASSESSMENT: 64 y.o. [place], [place] woman status post right breast upper inner quadrant biopsy 11/21/2017 for a clinical TXN0 invasive lobular carcinoma, grade 2, estrogen and progesterone receptor positive, HER-2 not amplified, with an MIB-1 of 2%.  (a) right breast upper outer quadrant biopsy 12/18/2017 showed no malignancy  (b) left breast retroareolar biopsy 12/18/2017 showed an atypical papillary lesion  (c) left lumpectomy 01/08/2018 showed an intraductal papilloma with no malignancy  (1) right lumpectomy and sentinel lymph node sampling 01/08/2018 showed a pT1b pN0, stage IA invasive lobular carcinoma, grade 1, with negative margins  (a) 2 right axillary lymph nodes were removed  (2) adjuvant radiation 02/15/2018 - 03/15/2018 Site/dose: 1. Right Breast / 42.56 Gy in 16 fractions 2. Boost / 8 Gy in 4 fractions Total dose of 50.56 Gy.  (4) tamoxifen started 04/12/2018  (5) genetics testing through the  breast GYN panel at in [place] found no pathogenic mutations   PLAN: Lisa Valenzuela is now 2 years out from definitive surgery for her breast cancer with no evidence of disease recurrence.  This  is very favorable.  She is tolerating tamoxifen well and the plan is to continue that a minimum of 5 years.  I think maybe she would benefit from MetroGel for her rosacea and I went ahead and prescribed that for her.  I do not see why she should have to pay $40 for tamoxifen which is usually less than $5 a month.  I went ahead and wrote her a prescription so she can try to get it filled at [facility].  She will let me know if there is any issue with that  Otherwise at this point I feel comfortable seeing her on a once a year basis.  She knows to call for any other problems that may develop before the next visit here.  Total encounter time 25 minutes.*   Lisa Valenzuela, Virgie Dad, MD  01/02/20 11:04 AM Medical Oncology and Hematology [facility] [place], [place] [address] Tel. [phone]    Fax. [phone]   I, Wilburn Mylar, am acting as scribe for Dr. Virgie Dad. Lisa Valenzuela.  I, Lurline Del MD, have reviewed the above documentation for accuracy and completeness, and I agree with the above.   *Total Encounter Time as defined by the Centers for Medicare and Medicaid Services includes, in addition to the face-to-face time of a patient visit (documented in the note above) non-face-to-face time: obtaining and reviewing outside history, ordering and reviewing medications, tests or procedures, care coordination (communications with other health care professionals or caregivers) and documentation in the medical record.

## 2020-01-02 ENCOUNTER — Other Ambulatory Visit: Payer: Self-pay

## 2020-01-02 ENCOUNTER — Inpatient Hospital Stay: Payer: 59

## 2020-01-02 ENCOUNTER — Inpatient Hospital Stay: Payer: 59 | Attending: Oncology | Admitting: Oncology

## 2020-01-02 VITALS — BP 127/81 | HR 76 | Temp 98.2°F | Resp 18 | Ht 62.5 in | Wt 147.8 lb

## 2020-01-02 DIAGNOSIS — Z79810 Long term (current) use of selective estrogen receptor modulators (SERMs): Secondary | ICD-10-CM | POA: Diagnosis not present

## 2020-01-02 DIAGNOSIS — Z803 Family history of malignant neoplasm of breast: Secondary | ICD-10-CM | POA: Diagnosis not present

## 2020-01-02 DIAGNOSIS — L719 Rosacea, unspecified: Secondary | ICD-10-CM | POA: Diagnosis not present

## 2020-01-02 DIAGNOSIS — M8580 Other specified disorders of bone density and structure, unspecified site: Secondary | ICD-10-CM

## 2020-01-02 DIAGNOSIS — Z170 Estrogen receptor positive status [ER+]: Secondary | ICD-10-CM | POA: Diagnosis not present

## 2020-01-02 DIAGNOSIS — C50211 Malignant neoplasm of upper-inner quadrant of right female breast: Secondary | ICD-10-CM

## 2020-01-02 LAB — COMPREHENSIVE METABOLIC PANEL
ALT: 17 U/L (ref 0–44)
AST: 22 U/L (ref 15–41)
Albumin: 4 g/dL (ref 3.5–5.0)
Alkaline Phosphatase: 59 U/L (ref 38–126)
Anion gap: 9 (ref 5–15)
BUN: 14 mg/dL (ref 8–23)
CO2: 27 mmol/L (ref 22–32)
Calcium: 9.4 mg/dL (ref 8.9–10.3)
Chloride: 106 mmol/L (ref 98–111)
Creatinine, Ser: 0.94 mg/dL (ref 0.44–1.00)
GFR calc Af Amer: >60 mL/min (ref >60)
GFR calc non Af Amer: >60 mL/min (ref >60)
Glucose, Bld: 108 mg/dL — ABNORMAL HIGH (ref 70–99)
Potassium: 4.1 mmol/L (ref 3.5–5.1)
Sodium: 142 mmol/L (ref 135–145)
Total Bilirubin: 0.4 mg/dL (ref 0.3–1.2)
Total Protein: 7.2 g/dL (ref 6.5–8.1)

## 2020-01-02 LAB — CBC WITH DIFFERENTIAL/PLATELET
Abs Immature Granulocytes: 0.02 10*3/uL (ref 0.00–0.07)
Basophils Absolute: 0 10*3/uL (ref 0.0–0.1)
Basophils Relative: 1 %
Eosinophils Absolute: 0.2 10*3/uL (ref 0.0–0.5)
Eosinophils Relative: 4 %
HCT: 41 % (ref 36.0–46.0)
Hemoglobin: 13.8 g/dL (ref 12.0–15.0)
Immature Granulocytes: 0 %
Lymphocytes Relative: 27 %
Lymphs Abs: 1.6 10*3/uL (ref 0.7–4.0)
MCH: 31.8 pg (ref 26.0–34.0)
MCHC: 33.7 g/dL (ref 30.0–36.0)
MCV: 94.5 fL (ref 80.0–100.0)
Monocytes Absolute: 0.6 10*3/uL (ref 0.1–1.0)
Monocytes Relative: 9 %
Neutro Abs: 3.7 10*3/uL (ref 1.7–7.7)
Neutrophils Relative %: 59 %
Platelets: 224 10*3/uL (ref 150–400)
RBC: 4.34 MIL/uL (ref 3.87–5.11)
RDW: 12.4 % (ref 11.5–15.5)
WBC: 6.2 10*3/uL (ref 4.0–10.5)
nRBC: 0 % (ref 0.0–0.2)

## 2020-01-02 MED ORDER — METRONIDAZOLE 1 % EX GEL: Freq: Every day | CUTANEOUS | 3 refills | Status: DC

## 2020-01-02 MED ORDER — TAMOXIFEN CITRATE 20 MG PO TABS: 20.0000 mg | ORAL_TABLET | Freq: Every day | ORAL | 4 refills | Status: DC

## 2020-01-03 ENCOUNTER — Telehealth: Payer: Self-pay | Admitting: Oncology

## 2020-01-03 NOTE — Telephone Encounter (Signed)
Scheduled appts per 3/18 los. Pt confirmed new appt date and time.

## 2020-01-07 ENCOUNTER — Other Ambulatory Visit: Payer: Self-pay | Admitting: *Deleted

## 2020-01-07 MED ORDER — METRONIDAZOLE 0.75 % EX GEL: 1.0000 "application " | Freq: Every day | CUTANEOUS | 3 refills | Status: DC

## 2020-01-07 NOTE — Progress Notes (Signed)
Received message from [facility] stating Metronidazole 1% topical gel is not covered by pt insurance but Metronidazole 0.75% topical gel is covered.  Per MD okay to order Metronidazole 0.75% topical gel to be applied to the affected area daily. Prescription sent to [facility] on file.

## 2020-04-16 ENCOUNTER — Ambulatory Visit: Payer: BC Managed Care – PPO | Admitting: Obstetrics & Gynecology

## 2020-05-13 ENCOUNTER — Other Ambulatory Visit: Payer: Self-pay | Admitting: *Deleted

## 2020-05-13 MED ORDER — TAMOXIFEN CITRATE 20 MG PO TABS: 20.0000 mg | ORAL_TABLET | Freq: Every day | ORAL | 4 refills | Status: DC

## 2020-07-09 NOTE — Progress Notes (Signed)
64 y.o. G2P0 Married White or Caucasian female here for annual exam.  Had Covid in July.  She had a very mild case.  Husband was hospitalized for 4 days.  He's about 90% better.  She had to be out of work 10 days.    Denies vaginal bleeding.    No LMP recorded. Patient is postmenopausal.          Sexually active: Yes.    The current method of family planning is post menopausal status.    Exercising: Yes.    walking Smoker:  no  Health Maintenance: Pap:  12-21-17 neg HPV HR neg with previous office History of abnormal Pap:  no MMG:  12-04-2019 category c density birads 2:neg Colonoscopy:  12/19 adenomatous polyp f/u 15yrs BMD:   07-05-18 osteopenic f/u 23yrs TDaP:  2019 Pneumonia vaccine(s):  Done 2020 Shingrix:   done  Hep C testing: unsure if done Screening Labs: Dr. Jana Hakim and Dr. Ernie Hew   reports that she has never smoked. She has never used smokeless tobacco. She reports that she does not drink alcohol and does not use drugs.  Past Medical History:  Diagnosis Date  . Breast cancer ([place])   . Family history of breast cancer   . Genetic testing 12/25/2017   Breast/GYN panel (23 genes) @ [facility] - No pathogenic mutations detected  . Personal history of radiation therapy   . Vitamin D deficiency     Past Surgical History:  Procedure Laterality Date  . BREAST EXCISIONAL BIOPSY Left 2019  . BREAST LUMPECTOMY Right 2019  . BREAST LUMPECTOMY WITH RADIOACTIVE SEED AND SENTINEL LYMPH NODE BIOPSY Bilateral 01/08/2018   Procedure: BILATERAL BREAST LUMPECTOMY WITH BILATERAL  RADIOACTIVE SEEDS AND RIGHT  SENTINEL LYMPH NODE BIOPSY;  Surgeon: Alphonsa Overall, MD;  Location: [facility];  Service: General;  Laterality: Bilateral;  . BREAST SURGERY     bilateral breast bx    Current Outpatient Medications  Medication Sig Dispense Refill  . Biotin w/ Vitamins C & E (HAIR/SKIN/NAILS PO) Take 1 tablet by mouth daily.    . calcium-vitamin D (OSCAL WITH D) 500-200 MG-UNIT tablet Take 1 tablet by mouth.     . Cholecalciferol (VITAMIN D3 PO) Take 1 tablet by mouth daily.    . metroNIDAZOLE (METROGEL) 0.75 % gel Apply 1 application topically daily. 45 g 3  . minocycline (MINOCIN) 50 MG capsule 1 capsule bid as needed for rosacea flare 30 capsule 2  . Multiple Vitamin (MULTIVITAMIN WITH MINERALS) TABS tablet Take 1 tablet by mouth daily. Centrum Women's 50+    . tamoxifen (NOLVADEX) 20 MG tablet Take 1 tablet (20 mg total) by mouth daily. 90 tablet 4   No current facility-administered medications for this visit.    Family History  Problem Relation Age of Onset  . Breast cancer Mother 51       currently 34  . Prostate cancer Father 6       currently 32  . Breast cancer Sister 74       currently 31  . Colon cancer Paternal Uncle        dx 92s; deceased 28s  . Bladder Cancer Maternal Grandmother        dx 70s; deceased 59s    Review of Systems  Constitutional: Negative.   HENT: Negative.   Eyes: Negative.   Respiratory: Negative.   Cardiovascular: Negative.   Gastrointestinal: Negative.   Endocrine: Negative.   Genitourinary: Negative.   Musculoskeletal: Negative.   Skin: Negative.  Allergic/Immunologic: Negative.   Neurological: Negative.   Hematological: Negative.   Psychiatric/Behavioral: Negative.     Exam:   BP 110/72   Pulse 68   Resp 16   Ht 5' 1.75" (1.568 m)   Wt 136 lb (61.7 kg)   BMI 25.08 kg/m   Height: 5' 1.75" (156.8 cm)  General appearance: alert, cooperative and appears stated age Head: Normocephalic, without obvious abnormality, atraumatic Neck: no adenopathy, supple, symmetrical, trachea midline and thyroid normal to inspection and palpation Lungs: clear to auscultation bilaterally Breasts: bilateral breast scars without masses, new skin changes, LAD, nipple discharge Heart: regular rate and rhythm Abdomen: soft, non-tender; bowel sounds normal; no masses,  no organomegaly Extremities: extremities normal, atraumatic, no cyanosis or edema Skin:  Skin color, texture, turgor normal. No rashes or lesions Lymph nodes: Cervical, supraclavicular, and axillary nodes normal. No abnormal inguinal nodes palpated Neurologic: Grossly normal   Pelvic: External genitalia:  no lesions              Urethra:  normal appearing urethra with no masses, tenderness or lesions              Bartholins and Skenes: normal                 Vagina: normal appearing vagina with normal color and discharge, no lesions              Cervix: no lesions              Pap taken: No. Bimanual Exam:  Uterus:  normal size, contour, position, consistency, mobility, non-tender              Adnexa: normal adnexa and no mass, fullness, tenderness               Rectovaginal: Confirms               Anus:  normal sphincter tone, no lesions  Chaperone, Terence Lux, CMA, was present for exam.  A:  Well Woman with normal exam PMP, no HRT H/o lobar breast cancer 2019, s/p radiation and new on Tamoxifen Strong family hx of breast disease (pt had negative testing) Rosacea Osteopenia   P:   Mammogram guidelines reviewed.  Doing yearly. pap smear not indicated.  Guidelines reviewed and specifically guidelines between ages 52-75.  Pt aware she should call with any vaginal bleeding. Colonoscopy 2019 with adenomatous polyp, follow up 5 years Vaccines reviewed.   Plan BMD in 1-2 more years   Will have lab work done again next year with Dr. Jana Hakim and Dr. Ernie Hew return annually or prn

## 2020-07-10 ENCOUNTER — Other Ambulatory Visit: Payer: Self-pay

## 2020-07-10 ENCOUNTER — Encounter: Payer: Self-pay | Admitting: Obstetrics & Gynecology

## 2020-07-10 ENCOUNTER — Ambulatory Visit (INDEPENDENT_AMBULATORY_CARE_PROVIDER_SITE_OTHER): Payer: 59 | Admitting: Obstetrics & Gynecology

## 2020-07-10 VITALS — BP 110/72 | HR 68 | Resp 16 | Ht 61.75 in | Wt 136.0 lb

## 2020-07-10 DIAGNOSIS — Z01419 Encounter for gynecological examination (general) (routine) without abnormal findings: Secondary | ICD-10-CM

## 2020-11-10 ENCOUNTER — Other Ambulatory Visit: Payer: Self-pay | Admitting: Oncology

## 2020-11-10 DIAGNOSIS — Z853 Personal history of malignant neoplasm of breast: Secondary | ICD-10-CM

## 2020-12-18 ENCOUNTER — Ambulatory Visit
Admission: RE | Admit: 2020-12-18 | Discharge: 2020-12-18 | Disposition: A | Discharge status: Home / Self Care | Payer: BC Managed Care – PPO | Source: Ambulatory Visit | Attending: Oncology | Admitting: Oncology

## 2020-12-18 ENCOUNTER — Other Ambulatory Visit: Payer: Self-pay

## 2020-12-18 ENCOUNTER — Other Ambulatory Visit: Payer: Self-pay | Admitting: Oncology

## 2020-12-18 DIAGNOSIS — Z1231 Encounter for screening mammogram for malignant neoplasm of breast: Secondary | ICD-10-CM | POA: Diagnosis not present

## 2020-12-18 DIAGNOSIS — Z853 Personal history of malignant neoplasm of breast: Secondary | ICD-10-CM

## 2021-01-02 NOTE — Progress Notes (Signed)
Lisa Valenzuela  [phone]) [phone] Fax:([phone]) [phone]     ID: Lisa Valenzuela DOB: [birth year]  MR#: [id number]  RAQ#:[id number]  Patient Care Team: Patient, No Pcp Per as PCP - General (General Practice) Lisa Valenzuela, Lisa Dad, MD as Consulting Physician (Oncology) Alphonsa Overall, MD as Consulting Physician (General Surgery) Lisa Rudd, MD as Consulting Physician (Radiation Oncology) Lisa Valenzuela, Lisa Massed, NP as Nurse Practitioner (Hematology and Oncology) Lisa Salon, MD as Consulting Physician (Gynecology) OTHER MD:  CHIEF COMPLAINT: Estrogen receptor positive lobular breast cancer  CURRENT TREATMENT: Tamoxifen   INTERVAL HISTORY: Lisa Valenzuela returns today for follow-up of her estrogen receptor positive breast cancer.  She continues on tamoxifen.  She has mild hot flashes usually around 4-6AM.  She describes these as not a problem.    Since her last visit, she underwent bilateral screening mammography with tomography at [facility] on 12/18/2020 showing: breast density category C; no evidence of malignancy in either breast.   Her rosacea is well controlled on her current medications.    REVIEW OF SYSTEMS: Lisa Valenzuela does have some leg cramps.  Perhaps once or twice a week.  She does not know if this is due to tamoxifen.  She also has a feeling of bloating.  She has lost a little weight, 9 pounds, but she says this is secondary to diet.  She also exercises chiefly by walking usually about 45mnutes several times a week.  A detailed review of systems today was otherwise stable   COVID 19 [place] recently 12[place]; infection 04/2020; no booster as of 01/05/2019   HISTORY OF CURRENT ILLNESS: From the original intake note:  Lisa REINECKEhad routine screening mammography on 11/14/2017 showing a possible abnormality in her bilateral breasts. She [facility] a bilateral diagnostic [facility]: breast density category C.  The left breast mass was evaluated and proved to be a benign cyst.  On the right however there was an area of architectural distortion, which could not be well delineated.  It was negative on ultrasonography.  The right axilla also was benign on ultrasound.  Accordingly on 11/21/2017 the patient proceeded to biopsy of the right breast area in question. 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 E-cadherin negative. The base of tumor is HER-2 negative and the proliferation marker Ki67 is 2%.   The patient's subsequent history is as detailed below.   PAST MEDICAL HISTORY: Past Medical History:  Diagnosis Date  . Breast cancer ([place]   . Family history of breast cancer   . Genetic testing 12/25/2017   Breast/GYN panel (23 genes) @ [facility] - No pathogenic mutations detected  . Personal history of radiation therapy   . Vitamin D deficiency     PAST SURGICAL HISTORY: Past Surgical History:  Procedure Laterality Date  . BREAST EXCISIONAL BIOPSY Left 2019  . BREAST LUMPECTOMY Right 2019  . BREAST LUMPECTOMY WITH RADIOACTIVE SEED AND SENTINEL LYMPH NODE BIOPSY Bilateral 01/08/2018   Procedure: BILATERAL BREAST LUMPECTOMY WITH BILATERAL  RADIOACTIVE SEEDS AND RIGHT  SENTINEL LYMPH NODE BIOPSY;  Surgeon: NAlphonsa Overall MD;  [facility]  Service: General;  Laterality: Bilateral;  . BREAST SURGERY     bilateral breast bx    FAMILY HISTORY Family History  Problem Relation Age of Onset  .  Breast cancer Mother 14       currently 66  . Prostate cancer Father 43       currently 54  . Breast cancer Sister 72       currently 7  . Colon cancer Paternal Uncle        dx 37s; deceased 51s  . Bladder Cancer Maternal Grandmother        dx 33s;  deceased 27s  The patient's father, 15, and mother, 54 are still alive. Her father was diagnosed with prostate cancer at 26 years old. Her mother was diagnosed with breast cancer at 65 years old. The patient has two brothers and two sisters. One of her sisters was diagnoses with breast cancer at 65 years old.    GYNECOLOGIC HISTORY:  No LMP recorded. Patient is postmenopausal. Menarche: 65 years old Age at first live birth: 65 years old [place] P 2 LMP 2009 Contraceptive yes, 1980-2000 HRT no  Hysterectomy? no SO? 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 Her husband, Lisa Valenzuela is a Theme park manager at [employer].  They have one daughter, Lisa Valenzuela,  who lives in [place], [place], and is a middle school Associate Professor. They also have one son, Lisa Valenzuela, who lives in [place], [place], and is a caddie and Cabin crew. The patient has two grandchildren.     ADVANCED DIRECTIVES: In the absence of any documentation to the contrary, the patient's spouse is their HCPOA.    HEALTH MAINTENANCE: Social History   Tobacco Use  . Smoking status: Never Smoker  . Smokeless tobacco: Never Used  Vaping Use  . Vaping Use: Never used  Substance Use Topics  . Alcohol use: No  . Drug use: No     Colonoscopy: 09/2018, recall 2024/ [facility]  PAP: 12/2017, negative  Bone density: 07/05/2018 showing a T score of -1.3.   No Known Allergies  Current Outpatient Medications  Medication Sig Dispense Refill  . Biotin w/ Vitamins C & E (HAIR/SKIN/NAILS PO) Take 1 tablet by mouth daily.    . calcium-vitamin D (OSCAL WITH D) 500-200 MG-UNIT tablet Take 1 tablet by mouth.    . Cholecalciferol (VITAMIN D3 PO) Take 1 tablet by mouth daily.    . metroNIDAZOLE (METROGEL) 0.75 % gel Apply 1 application topically daily. 45 g 3  . minocycline (MINOCIN) 50 MG capsule 1 capsule bid as needed for rosacea flare 30 capsule 2  . Multiple Vitamin (MULTIVITAMIN WITH MINERALS) TABS tablet Take 1 tablet by mouth  daily. Centrum Women's 50+    . tamoxifen (NOLVADEX) 20 MG tablet Take 1 tablet (20 mg total) by mouth daily. 90 tablet 4   No current facility-administered medications for this visit.    OBJECTIVE:  white woman who appears younger than stated age  27:   01/04/21 0852  BP: 140/76  Pulse: 71  Resp: 20  Temp: 97.9 F (36.6 C)  SpO2: 100%     Body mass index is 26.11 kg/m.   Wt Readings from Last 3 Encounters:  01/04/21 141 lb 9.6 oz (64.2 kg)  07/10/20 136 lb (61.7 kg)  01/02/20 147 lb 12.8 oz (67 kg)      ECOG FS:0 - Asymptomatic  Sclerae unicteric, EOMs intact Wearing a mask No cervical or supraclavicular adenopathy Lungs no rales or rhonchi Heart regular rate and rhythm Abd soft, nontender, positive bowel sounds MSK no focal spinal tenderness, no upper extremity lymphedema Neuro: nonfocal, well oriented, appropriate affect Breasts: Status post bilateral lumpectomies.  There is no palpable mass.  There are no skin or nipple changes of concern.  Both axillae are benign   LAB RESULTS:  CMP     Component Value Date/Time   NA 142 01/02/2020 0920   K 4.1 01/02/2020 0920   CL 106 01/02/2020 0920   CO2 27 01/02/2020 0920   GLUCOSE 108 (H) 01/02/2020 0920   BUN 14 01/02/2020 0920   CREATININE 0.94 01/02/2020 0920   CREATININE 0.90 07/23/2018 0953   CALCIUM 9.4 01/02/2020 0920   PROT 7.2 01/02/2020 0920   ALBUMIN 4.0 01/02/2020 0920   AST 22 01/02/2020 0920   AST 22 07/23/2018 0953   ALT 17 01/02/2020 0920   ALT 17 07/23/2018 0953   ALKPHOS 59 01/02/2020 0920   BILITOT 0.4 01/02/2020 0920   BILITOT 0.4 07/23/2018 0953   GFRNONAA >60 01/02/2020 0920   GFRNONAA >60 07/23/2018 0953   GFRAA >60 01/02/2020 0920   GFRAA >60 07/23/2018 0953    Lab Results  Component Value Date   WBC 6.5 01/04/2021   NEUTROABS 3.8 01/04/2021   HGB 13.1 01/04/2021   HCT 38.6 01/04/2021   MCV 92.8 01/04/2021   PLT 193 01/04/2021   No results found for: LABCA2  No  components found for: DHRCBU384  No results for input(s): INR in the last 168 hours.  No results found for: LABCA2  No results found for: TXM468  No results found for: EHO122  No results found for: QMG500  No results found for: CA2729  No components found for: HGQUANT  No results found for: CEA1 / No results found for: CEA1   No results found for: AFPTUMOR  No results found for: CHROMOGRNA  No results found for: TOTALPROTELP, ALBUMINELP, A1GS, A2GS, BETS, BETA2SER, GAMS, MSPIKE, SPEI (this displays SPEP labs)  No results found for: KPAFRELGTCHN, LAMBDASER, KAPLAMBRATIO (kappa/lambda light chains)  No results found for: HGBA, HGBA2QUANT, HGBFQUANT, HGBSQUAN (Hemoglobinopathy evaluation)   No results found for: LDH  No results found for: IRON, TIBC, IRONPCTSAT (Iron and TIBC)  No results found for: FERRITIN  Urinalysis No results found for: COLORURINE, APPEARANCEUR, LABSPEC, PHURINE, GLUCOSEU, HGBUR, BILIRUBINUR, KETONESUR, PROTEINUR, UROBILINOGEN, NITRITE, LEUKOCYTESUR   STUDIES: MM 3D SCREEN BREAST BILATERAL  Result Date: 12/22/2020 CLINICAL DATA:  Screening. EXAM: DIGITAL SCREENING BILATERAL MAMMOGRAM WITH TOMOSYNTHESIS AND CAD TECHNIQUE: Bilateral screening digital craniocaudal and mediolateral oblique mammograms were obtained. Bilateral screening digital breast tomosynthesis was performed. The images were evaluated with computer-aided detection. COMPARISON:  Previous exam(s). ACR Breast Density Category c: The breast tissue is heterogeneously dense, which may obscure small masses. FINDINGS: There are no findings suspicious for malignancy. The images were evaluated with computer-aided detection. IMPRESSION: No mammographic evidence of malignancy. A result letter of this screening mammogram will be mailed directly to the patient. RECOMMENDATION: Screening mammogram in one year. (Code:SM-B-01Y) BI-RADS CATEGORY  1: Negative. Electronically Signed   By: Everlean Alstrom  M.D.   On: 12/22/2020 09:22    ELIGIBLE FOR AVAILABLE RESEARCH PROTOCOL:no  ASSESSMENT: 65 y.o. [place], [place] woman status post right breast upper inner quadrant biopsy 11/21/2017 for a clinical TXN0 invasive lobular carcinoma, grade 2, estrogen and progesterone receptor positive, HER-2 not amplified, with an MIB-1 of 2%.  (a) right breast upper outer quadrant biopsy 12/18/2017 showed no malignancy  (b) left breast retroareolar biopsy 12/18/2017 showed an atypical papillary lesion  (c) left lumpectomy 01/08/2018 showed an intraductal papilloma with no malignancy  (1) right lumpectomy and sentinel lymph node sampling 01/08/2018 showed a pT1b  pN0, stage IA invasive lobular carcinoma, grade 1, with negative margins  (a) 2 right axillary lymph nodes were removed  (2) adjuvant radiation 02/15/2018 - 03/15/2018 Site/dose: 1. Right Breast / 42.56 Gy in 16 fractions 2. Boost / 8 Gy in 4 fractions Total dose of 50.56 Gy.  (4) tamoxifen started 04/12/2018  (5) genetics testing through the breast GYN panel at [facility] found no pathogenic mutations   PLAN: Dejha is now 3 years out from definitive surgery for her breast cancer with no evidence of disease recurrence.  This is very favorable.  She is tolerating tamoxifen well and the plan is to continue that a total of 5 years.  I do not think her cramps are likely to be due to tamoxifen overloaded that is not impossible.  I suggested she backtrack when she gets a cramp and tried to decide if she drank enough water that day, and whether or not she exercised that day.  She may be able to come up with some associations that may help explain that issue.  I am not sure why she is having some bloating.  The exam today was unremarkable.  Her mild weight loss is voluntary based on diet and exercise.    She will see [place] again in 1 year.  She knows to call for any other issue that may develop before the next visit.  Lisa Valenzuela, Lisa Dad, MD  01/04/21 9:02  AM Medical Oncology and Hematology [facility] [place], [place] [address] Tel. [phone]    Fax. [phone]   I, Wilburn Mylar, am acting as scribe for Dr. Virgie Valenzuela. Lisa Valenzuela.  I, Lurline Del MD, have reviewed the above documentation for accuracy and completeness, and I agree with the above.   *Total Encounter Time as defined by the Centers for Medicare and Medicaid Services includes, in addition to the face-to-face time of a patient visit (documented in the note above) non-face-to-face time: obtaining and reviewing outside history, ordering and reviewing medications, tests or procedures, care coordination (communications with other health care professionals or caregivers) and documentation in the medical record.

## 2021-01-04 ENCOUNTER — Inpatient Hospital Stay: Payer: BC Managed Care – PPO | Attending: Oncology | Admitting: Oncology

## 2021-01-04 ENCOUNTER — Other Ambulatory Visit: Payer: Self-pay

## 2021-01-04 ENCOUNTER — Inpatient Hospital Stay: Payer: BC Managed Care – PPO

## 2021-01-04 VITALS — BP 140/76 | HR 71 | Temp 97.9°F | Resp 20 | Ht 61.75 in | Wt 141.6 lb

## 2021-01-04 DIAGNOSIS — Z79810 Long term (current) use of selective estrogen receptor modulators (SERMs): Secondary | ICD-10-CM | POA: Diagnosis not present

## 2021-01-04 DIAGNOSIS — Z923 Personal history of irradiation: Secondary | ICD-10-CM | POA: Diagnosis not present

## 2021-01-04 DIAGNOSIS — Z170 Estrogen receptor positive status [ER+]: Secondary | ICD-10-CM | POA: Insufficient documentation

## 2021-01-04 DIAGNOSIS — C50211 Malignant neoplasm of upper-inner quadrant of right female breast: Secondary | ICD-10-CM

## 2021-01-04 DIAGNOSIS — R140 Abdominal distension (gaseous): Secondary | ICD-10-CM | POA: Insufficient documentation

## 2021-01-04 DIAGNOSIS — M8580 Other specified disorders of bone density and structure, unspecified site: Secondary | ICD-10-CM

## 2021-01-04 LAB — COMPREHENSIVE METABOLIC PANEL
ALT: 17 U/L (ref 0–44)
AST: 18 U/L (ref 15–41)
Albumin: 3.8 g/dL (ref 3.5–5.0)
Alkaline Phosphatase: 61 U/L (ref 38–126)
Anion gap: 7 (ref 5–15)
BUN: 16 mg/dL (ref 8–23)
CO2: 26 mmol/L (ref 22–32)
Calcium: 8.9 mg/dL (ref 8.9–10.3)
Chloride: 108 mmol/L (ref 98–111)
Creatinine, Ser: 0.93 mg/dL (ref 0.44–1.00)
GFR, Estimated: >60 mL/min (ref >60)
Glucose, Bld: 104 mg/dL — ABNORMAL HIGH (ref 70–99)
Potassium: 4 mmol/L (ref 3.5–5.1)
Sodium: 141 mmol/L (ref 135–145)
Total Bilirubin: 0.3 mg/dL (ref 0.3–1.2)
Total Protein: 6.9 g/dL (ref 6.5–8.1)

## 2021-01-04 LAB — CBC WITH DIFFERENTIAL/PLATELET
Abs Immature Granulocytes: 0.02 10*3/uL (ref 0.00–0.07)
Basophils Absolute: 0 10*3/uL (ref 0.0–0.1)
Basophils Relative: 1 %
Eosinophils Absolute: 0.2 10*3/uL (ref 0.0–0.5)
Eosinophils Relative: 3 %
HCT: 38.6 % (ref 36.0–46.0)
Hemoglobin: 13.1 g/dL (ref 12.0–15.0)
Immature Granulocytes: 0 %
Lymphocytes Relative: 30 %
Lymphs Abs: 1.9 10*3/uL (ref 0.7–4.0)
MCH: 31.5 pg (ref 26.0–34.0)
MCHC: 33.9 g/dL (ref 30.0–36.0)
MCV: 92.8 fL (ref 80.0–100.0)
Monocytes Absolute: 0.5 10*3/uL (ref 0.1–1.0)
Monocytes Relative: 7 %
Neutro Abs: 3.8 10*3/uL (ref 1.7–7.7)
Neutrophils Relative %: 59 %
Platelets: 193 10*3/uL (ref 150–400)
RBC: 4.16 MIL/uL (ref 3.87–5.11)
RDW: 12.5 % (ref 11.5–15.5)
WBC: 6.5 10*3/uL (ref 4.0–10.5)
nRBC: 0 % (ref 0.0–0.2)

## 2021-01-04 MED ORDER — TAMOXIFEN CITRATE 20 MG PO TABS: 20.0000 mg | ORAL_TABLET | Freq: Every day | ORAL | 4 refills | Status: DC

## 2021-01-04 MED ORDER — MINOCYCLINE HCL 50 MG PO CAPS: ORAL_CAPSULE | ORAL | 4 refills | Status: DC

## 2021-01-05 ENCOUNTER — Telehealth: Payer: Self-pay | Admitting: Oncology

## 2021-01-05 NOTE — Telephone Encounter (Signed)
Scheduled appts per 3/21 los. Pt aware.  

## 2021-07-19 ENCOUNTER — Ambulatory Visit (HOSPITAL_BASED_OUTPATIENT_CLINIC_OR_DEPARTMENT_OTHER): Payer: 59 | Admitting: Obstetrics & Gynecology

## 2021-09-03 ENCOUNTER — Ambulatory Visit (HOSPITAL_BASED_OUTPATIENT_CLINIC_OR_DEPARTMENT_OTHER): Payer: BC Managed Care – PPO | Admitting: Obstetrics & Gynecology

## 2021-09-08 ENCOUNTER — Ambulatory Visit (INDEPENDENT_AMBULATORY_CARE_PROVIDER_SITE_OTHER): Payer: BC Managed Care – PPO | Admitting: Obstetrics & Gynecology

## 2021-09-08 ENCOUNTER — Encounter (HOSPITAL_BASED_OUTPATIENT_CLINIC_OR_DEPARTMENT_OTHER): Payer: Self-pay | Admitting: Obstetrics & Gynecology

## 2021-09-08 ENCOUNTER — Other Ambulatory Visit (HOSPITAL_COMMUNITY)
Admission: RE | Admit: 2021-09-08 | Discharge: 2021-09-08 | Disposition: A | Discharge status: Home / Self Care | Payer: BC Managed Care – PPO | Source: Ambulatory Visit | Attending: Obstetrics & Gynecology | Admitting: Obstetrics & Gynecology

## 2021-09-08 VITALS — BP 139/72 | HR 75 | Ht 61.5 in | Wt 145.8 lb

## 2021-09-08 DIAGNOSIS — C50211 Malignant neoplasm of upper-inner quadrant of right female breast: Secondary | ICD-10-CM

## 2021-09-08 DIAGNOSIS — Z23 Encounter for immunization: Secondary | ICD-10-CM | POA: Diagnosis not present

## 2021-09-08 DIAGNOSIS — Z124 Encounter for screening for malignant neoplasm of cervix: Secondary | ICD-10-CM

## 2021-09-08 DIAGNOSIS — Z01419 Encounter for gynecological examination (general) (routine) without abnormal findings: Secondary | ICD-10-CM | POA: Diagnosis not present

## 2021-09-08 DIAGNOSIS — M8580 Other specified disorders of bone density and structure, unspecified site: Secondary | ICD-10-CM

## 2021-09-08 DIAGNOSIS — Z170 Estrogen receptor positive status [ER+]: Secondary | ICD-10-CM

## 2021-09-08 NOTE — Progress Notes (Signed)
65 y.o. G2P0 Married White or Caucasian female here for annual exam.  Doing well.  Denies vaginal bleeding.  Going to see friends in [place] over Thanksgiving.  No LMP recorded. Patient is postmenopausal.          Sexually active: Yes.    The current method of family planning is post menopausal status.    Exercising: not very much Smoker:  no  Health Maintenance: Pap:  2019.  Will obtain today. History of abnormal Pap:  no MMG:  12/18/2020 Negative Colonoscopy: 12/19 adenomatous polyp f/u 5 years BMD:   07/05/2018, -1.4 Screening Labs: done 2020   reports that she has never smoked. She has never used smokeless tobacco. She reports that she does not drink alcohol and does not use drugs.  Past Medical History:  Diagnosis Date   Breast cancer [facility])    Family history of breast cancer    Genetic testing 12/25/2017   Breast/GYN panel (23 genes) @ [facility] - No pathogenic mutations detected   Personal history of radiation therapy    Vitamin D deficiency     Past Surgical History:  Procedure Laterality Date   BREAST EXCISIONAL BIOPSY Left 2019   BREAST LUMPECTOMY Right 2019   BREAST LUMPECTOMY WITH RADIOACTIVE SEED AND SENTINEL LYMPH NODE BIOPSY Bilateral 01/08/2018   Procedure: BILATERAL BREAST LUMPECTOMY WITH BILATERAL  RADIOACTIVE SEEDS AND RIGHT  SENTINEL LYMPH NODE BIOPSY;  Surgeon: Alphonsa Overall, MD;  Location: [facility];  Service: General;  Laterality: Bilateral;   BREAST SURGERY     bilateral breast bx    Current Outpatient Medications  Medication Sig Dispense Refill   calcium-vitamin D (OSCAL WITH D) 500-200 MG-UNIT tablet Take 1 tablet by mouth.     Cholecalciferol (VITAMIN D3 PO) Take 1 tablet by mouth daily.     metroNIDAZOLE (METROGEL) 0.75 % gel Apply 1 application topically daily. 45 g 3   minocycline (MINOCIN) 50 MG capsule 1 capsule bid as needed for rosacea flare 90 capsule 4   Multiple Vitamin (MULTIVITAMIN WITH MINERALS) TABS tablet Take 1 tablet by mouth daily.  Centrum Women's 50+     tamoxifen (NOLVADEX) 20 MG tablet Take 1 tablet (20 mg total) by mouth daily. 90 tablet 4   No current facility-administered medications for this visit.    Family History  Problem Relation Age of Onset   Breast cancer Mother 35       currently 58   Prostate cancer Father 80       currently 42   Breast cancer Sister 29       currently 42   Colon cancer Paternal Uncle        dx 63s; deceased 14s   Bladder Cancer Maternal Grandmother        dx 29s; deceased 57s    Review of Systems  All other systems reviewed and are negative.  Exam:   BP 139/72 (BP Location: Left Arm, Patient Position: Sitting, Cuff Size: Large)   Pulse 75   Ht 5' 1.5" (1.562 m)   Wt 145 lb 12.8 oz (66.1 kg)   BMI 27.10 kg/m   Height: 5' 1.5" (156.2 cm)  General appearance: alert, cooperative and appears stated age Head: Normocephalic, without obvious abnormality, atraumatic Neck: no adenopathy, supple, symmetrical, trachea midline and thyroid normal to inspection and palpation Lungs: clear to auscultation bilaterally Breasts: right breast incision, well healed with stable radiation changes, no new masses, skin changes, LAD; left breast scar that is well healed, no masses, skin  changes, LAD noted.  No nipple discharge bilaterally. Heart: regular rate and rhythm Abdomen: soft, non-tender; bowel sounds normal; no masses,  no organomegaly Extremities: extremities normal, atraumatic, no cyanosis or edema Skin: Skin color, texture, turgor normal. No rashes or lesions Lymph nodes: Cervical, supraclavicular, and axillary nodes normal. No abnormal inguinal nodes palpated Neurologic: Grossly normal   Pelvic: External genitalia:  no lesions              Urethra:  normal appearing urethra with no masses, tenderness or lesions              Bartholins and Skenes: normal                 Vagina: normal appearing vagina with normal color and no discharge, no lesions              Cervix: no  lesions              Pap taken: Yes.   Bimanual Exam:  Uterus:  normal size, contour, position, consistency, mobility, non-tender              Adnexa: normal adnexa and no mass, fullness, tenderness               Rectovaginal: Confirms               Anus:  normal sphincter tone, no lesions  Chaperone, Octaviano Batty, CMA, was present for exam.  Assessment/Plan: 1. Well woman exam with routine gynecological exam - pap and HR HPV obtained today. Screening guidelines reviewed. - MMG up to date - Will plan BMD with next MMG - colonoscopy 2019.  Follow up 5 years - vaccines reviewed - Flu Vaccine QUAD High Dose(Fluad) - pt aware needs pneumonia vaccination as well.  Is establishing care with new PCP in 2023.  2. Osteopenia, unspecified location - DG BONE DENSITY (DXA); Future  3. Malignant neoplasm of upper-inner quadrant of right breast in female, estrogen receptor positive ([place]) - lobar breast cancer 2019, s/p radiation and on Tamoxifen x 5 years

## 2021-09-14 LAB — CYTOLOGY - PAP
Comment: NEGATIVE
Diagnosis: NEGATIVE
Diagnosis: REACTIVE
High risk HPV: NEGATIVE

## 2021-11-24 ENCOUNTER — Other Ambulatory Visit: Payer: Self-pay | Admitting: Obstetrics & Gynecology

## 2021-11-24 DIAGNOSIS — Z1231 Encounter for screening mammogram for malignant neoplasm of breast: Secondary | ICD-10-CM

## 2021-12-20 ENCOUNTER — Ambulatory Visit
Admission: RE | Admit: 2021-12-20 | Discharge: 2021-12-20 | Disposition: A | Discharge status: Home / Self Care | Payer: BC Managed Care – PPO | Source: Ambulatory Visit | Attending: Obstetrics & Gynecology | Admitting: Obstetrics & Gynecology

## 2021-12-20 DIAGNOSIS — Z1231 Encounter for screening mammogram for malignant neoplasm of breast: Secondary | ICD-10-CM | POA: Diagnosis not present

## 2021-12-22 ENCOUNTER — Other Ambulatory Visit: Payer: Self-pay

## 2021-12-22 MED ORDER — TAMOXIFEN CITRATE 20 MG PO TABS: 20.0000 mg | ORAL_TABLET | Freq: Every day | ORAL | 3 refills | Status: DC

## 2022-01-12 ENCOUNTER — Other Ambulatory Visit: Payer: BC Managed Care – PPO

## 2022-01-12 ENCOUNTER — Ambulatory Visit: Payer: BC Managed Care – PPO | Admitting: Hematology and Oncology

## 2022-01-13 ENCOUNTER — Other Ambulatory Visit: Payer: Self-pay

## 2022-01-13 ENCOUNTER — Inpatient Hospital Stay: Payer: BC Managed Care – PPO | Attending: Hematology and Oncology

## 2022-01-13 ENCOUNTER — Inpatient Hospital Stay (HOSPITAL_BASED_OUTPATIENT_CLINIC_OR_DEPARTMENT_OTHER): Payer: BC Managed Care – PPO | Admitting: Hematology and Oncology

## 2022-01-13 ENCOUNTER — Encounter: Payer: Self-pay | Admitting: Hematology and Oncology

## 2022-01-13 VITALS — BP 136/84 | HR 76 | Temp 98.1°F | Resp 16 | Ht 61.0 in | Wt 143.1 lb

## 2022-01-13 DIAGNOSIS — C50211 Malignant neoplasm of upper-inner quadrant of right female breast: Secondary | ICD-10-CM

## 2022-01-13 DIAGNOSIS — Z170 Estrogen receptor positive status [ER+]: Secondary | ICD-10-CM | POA: Insufficient documentation

## 2022-01-13 DIAGNOSIS — R232 Flushing: Secondary | ICD-10-CM | POA: Insufficient documentation

## 2022-01-13 DIAGNOSIS — Z79810 Long term (current) use of selective estrogen receptor modulators (SERMs): Secondary | ICD-10-CM | POA: Diagnosis not present

## 2022-01-13 LAB — CBC WITH DIFFERENTIAL (CANCER CENTER ONLY)
Abs Immature Granulocytes: 0.02 10*3/uL (ref 0.00–0.07)
Basophils Absolute: 0 10*3/uL (ref 0.0–0.1)
Basophils Relative: 1 %
Eosinophils Absolute: 0.3 10*3/uL (ref 0.0–0.5)
Eosinophils Relative: 4 %
HCT: 38.7 % (ref 36.0–46.0)
Hemoglobin: 13.1 g/dL (ref 12.0–15.0)
Immature Granulocytes: 0 %
Lymphocytes Relative: 27 %
Lymphs Abs: 1.6 10*3/uL (ref 0.7–4.0)
MCH: 31.3 pg (ref 26.0–34.0)
MCHC: 33.9 g/dL (ref 30.0–36.0)
MCV: 92.4 fL (ref 80.0–100.0)
Monocytes Absolute: 0.5 10*3/uL (ref 0.1–1.0)
Monocytes Relative: 8 %
Neutro Abs: 3.7 10*3/uL (ref 1.7–7.7)
Neutrophils Relative %: 60 %
Platelet Count: 206 10*3/uL (ref 150–400)
RBC: 4.19 MIL/uL (ref 3.87–5.11)
RDW: 12.9 % (ref 11.5–15.5)
WBC Count: 6.1 10*3/uL (ref 4.0–10.5)
nRBC: 0 % (ref 0.0–0.2)

## 2022-01-13 LAB — CMP (CANCER CENTER ONLY)
ALT: 14 U/L (ref 0–44)
AST: 18 U/L (ref 15–41)
Albumin: 4.1 g/dL (ref 3.5–5.0)
Alkaline Phosphatase: 53 U/L (ref 38–126)
Anion gap: 7 (ref 5–15)
BUN: 16 mg/dL (ref 8–23)
CO2: 29 mmol/L (ref 22–32)
Calcium: 9.6 mg/dL (ref 8.9–10.3)
Chloride: 106 mmol/L (ref 98–111)
Creatinine: 0.91 mg/dL (ref 0.44–1.00)
GFR, Estimated: >60 mL/min (ref >60)
Glucose, Bld: 110 mg/dL — ABNORMAL HIGH (ref 70–99)
Potassium: 3.9 mmol/L (ref 3.5–5.1)
Sodium: 142 mmol/L (ref 135–145)
Total Bilirubin: 0.4 mg/dL (ref 0.3–1.2)
Total Protein: 6.9 g/dL (ref 6.5–8.1)

## 2022-01-13 MED ORDER — TAMOXIFEN CITRATE 20 MG PO TABS: 20.0000 mg | ORAL_TABLET | Freq: Every day | ORAL | 3 refills | Status: DC

## 2022-01-13 NOTE — Progress Notes (Signed)
?Lisa Valenzuela  ?[phone] [phone]  ? ? ? ?ID: VIERA OKONSKI DOB: 14-Nov-1955  MR#: [id number]  LPF#:[id number] ? ?Patient Care Team: ?Patient, No Pcp Per (Inactive) as PCP - General (General Practice) ?Magrinat, Virgie Dad, MD (Inactive) as Consulting Physician (Oncology) ?Alphonsa Overall, MD as Consulting Physician (General Surgery) ?Kyung Rudd, MD as Consulting Physician (Radiation Oncology) ?Gardenia Phlegm, NP as Nurse Practitioner (Hematology and Oncology) ?Megan Salon, MD as Consulting Physician (Gynecology) ?OTHER MD: ? ?CHIEF COMPLAINT: Estrogen receptor positive lobular breast cancer ? ?CURRENT TREATMENT: Tamoxifen ? ?INTERVAL HISTORY: ? ?[name] returns today for follow-up of her estrogen receptor positive breast cancer. ?She has been taking tamoxifen as instructed, has some mild hot flashes, overall tolerating it very well.  Most recent mammogram in March 2023 without any evidence of malignancy.  She denies any changes in her breast today.  No vaginal bleeding, change in breathing or lower extremity swelling.  Rest of the pertinent 10 point ROS reviewed and negative. ? ? COVID 19 VACCINATION STATUS: Lisa Valenzuela x2, most recently 12/2019; infection 04/2020; no booster as of 01/05/2019 ? ? ?HISTORY OF CURRENT ILLNESS: ?From the original intake note: ? ?Lisa Valenzuela had routine screening mammography on 11/14/2017 showing a possible abnormality in her bilateral breasts. She underwent a bilateral diagnostic mammography with tomography and bilateral breast ultrasonography at [facility] on 11/17/2017 showing: breast density category C.  The left breast mass was evaluated and proved to be a benign cyst. ? ?On the right however there was an area of architectural distortion, which could not be well delineated.  It was negative on ultrasonography.  The right axilla also was benign on ultrasound. ? ?Accordingly on 11/21/2017 the patient proceeded to biopsy of the right  breast area in question. 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 E-cadherin negative. The base of tumor is HER-2 negative and the proliferation marker Ki67 is 2%.  ? ?The patient's subsequent history is as detailed below. ? ? ?PAST MEDICAL HISTORY: ?Past Medical History:  ?Diagnosis Date  ? Breast cancer ([place])   ? Family history of breast cancer   ? Genetic testing 12/25/2017  ? Breast/GYN panel (23 genes) @ [facility] - No pathogenic mutations detected  ? Personal history of radiation therapy   ? Vitamin D deficiency   ? ? ?PAST SURGICAL HISTORY: ?Past Surgical History:  ?Procedure Laterality Date  ? BREAST EXCISIONAL BIOPSY Left 2019  ? BREAST LUMPECTOMY Right 2019  ? BREAST LUMPECTOMY WITH RADIOACTIVE SEED AND SENTINEL LYMPH NODE BIOPSY Bilateral 01/08/2018  ? Procedure: BILATERAL BREAST LUMPECTOMY WITH BILATERAL  RADIOACTIVE SEEDS AND RIGHT  SENTINEL LYMPH NODE BIOPSY;  Surgeon: Alphonsa Overall, MD;  Location: [facility];  Service: General;  Laterality: Bilateral;  ? BREAST SURGERY    ? bilateral breast bx  ? ? ?FAMILY HISTORY ?Family History  ?Problem Relation Age of Onset  ? Breast cancer Mother 61  ?     currently 24  ? Prostate cancer Father 27  ?     currently 16  ? Breast cancer Sister 58  ?     currently 31  ? Colon cancer Paternal Uncle   ?     dx 12s; deceased 3s  ? Bladder Cancer Maternal Grandmother   ?     dx 5s; deceased 25s  ?The patient's father, 68, and  mother, 58 are still alive. Her father was diagnosed with prostate cancer at 29 years old. Her mother was diagnosed with breast cancer at 66 years old. The patient has two brothers and two sisters. One of her sisters was diagnoses with breast cancer at 66 years old.  ? ? ?GYNECOLOGIC HISTORY:  ?No LMP recorded. Patient is postmenopausal. ?Menarche: 66 years old ?Age at first  live birth: 66 years old ?GX P 2 ?LMP 2009 ?Contraceptive yes, 1980-2000 ?HRT no  ?Hysterectomy? no ?SO? no ? ? ?SOCIAL HISTORY:  ?Penni works as an Counselling psychologist. Her husband, Kasandra Knudsen is a Theme park manager at [employer].  They have one daughter, Lisa Valenzuela,  who lives in [place], [place], and is a middle school Associate Professor. They also have one son, Lisa Valenzuela, who lives in [place], [place], and is a caddie and Cabin crew. The patient has two grandchildren.  ?  ? ADVANCED DIRECTIVES: In the absence of any documentation to the contrary, the patient's spouse is their HCPOA.  ? ? ?HEALTH MAINTENANCE: ?Social History  ? ?Tobacco Use  ? Smoking status: Never  ? Smokeless tobacco: Never  ?Vaping Use  ? Vaping Use: Never used  ?Substance Use Topics  ? Alcohol use: No  ? Drug use: No  ? ? ? Colonoscopy: 09/2018, recall 2024/ [facility] ? PAP: 12/2017, negative ? Bone density: 07/05/2018 showing a T score of -1.3. ?  ?No Known Allergies ? ?Current Outpatient Medications  ?Medication Sig Dispense Refill  ? calcium-vitamin D (OSCAL WITH D) 500-200 MG-UNIT tablet Take 1 tablet by mouth.    ? Cholecalciferol (VITAMIN D3 PO) Take 1 tablet by mouth daily.    ? metroNIDAZOLE (METROGEL) 0.75 % gel Apply 1 application topically daily. 45 g 3  ? minocycline (MINOCIN) 50 MG capsule 1 capsule bid as needed for rosacea flare 90 capsule 4  ? Multiple Vitamin (MULTIVITAMIN WITH MINERALS) TABS tablet Take 1 tablet by mouth daily. Centrum Women's 50+    ? tamoxifen (NOLVADEX) 20 MG tablet Take 1 tablet (20 mg total) by mouth daily. 90 tablet 3  ? ?No current facility-administered medications for this visit.  ? ? ?OBJECTIVE:  white woman who appears younger than stated age ? ?Vitals:  ? 01/13/22 0932  ?BP: 136/84  ?Pulse: 76  ?Resp: 16  ?Temp: 98.1 ?F (36.7 ?C)  ?SpO2: 100%  ?   Body mass index is 27.04 kg/m?.   ?Wt Readings from Last 3 Encounters:  ?01/13/22 143 lb 1.6 oz (64.9 kg)  ?09/08/21 145 lb 12.8 oz (66.1 kg)  ?01/04/21 141 lb 9.6 oz  (64.2 kg)  ? ? ?  ECOG FS:0 - Asymptomatic ? ?Physical Exam ?Constitutional:   ?   Appearance: Normal appearance.  ?HENT:  ?   Head: Normocephalic and atraumatic.  ?Cardiovascular:  ?   Rate and Rhythm: Normal rate and regular rhythm.  ?Pulmonary:  ?   Effort: Pulmonary effort is normal.  ?   Breath sounds: Normal breath sounds.  ?Abdominal:  ?   General: Abdomen is flat. Bowel sounds are normal.  ?   Palpations: Abdomen is soft.  ?Musculoskeletal:     ?   General: No swelling or tenderness. Normal range of motion.  ?   Cervical back: Normal range of motion and neck supple. No rigidity.  ?Lymphadenopathy:  ?   Cervical: No cervical adenopathy.  ?Skin: ?   General: Skin is warm and dry.  ?Neurological:  ?   General: No focal deficit present.  ?   Mental  Status: She is alert.  ?Psychiatric:     ?   Mood and Affect: Mood normal.  ? ?Right ? ?Oh thank you ma'am ?LAB RESULTS: ? ?CMP  ?   ?Component Value Date/Time  ? NA 142 01/13/2022 0903  ? K 3.9 01/13/2022 0903  ? CL 106 01/13/2022 0903  ? CO2 29 01/13/2022 0903  ? GLUCOSE 110 (H) 01/13/2022 0903  ? BUN 16 01/13/2022 0903  ? CREATININE 0.91 01/13/2022 0903  ? CALCIUM 9.6 01/13/2022 0903  ? PROT 6.9 01/13/2022 0903  ? ALBUMIN 4.1 01/13/2022 0903  ? AST 18 01/13/2022 0903  ? ALT 14 01/13/2022 0903  ? ALKPHOS 53 01/13/2022 0903  ? BILITOT 0.4 01/13/2022 0903  ? GFRNONAA >60 01/13/2022 0903  ? GFRAA >60 01/02/2020 0920  ? GFRAA >60 07/23/2018 0953  ? ? ?Lab Results  ?Component Value Date  ? WBC 6.1 01/13/2022  ? NEUTROABS 3.7 01/13/2022  ? HGB 13.1 01/13/2022  ? HCT 38.7 01/13/2022  ? MCV 92.4 01/13/2022  ? PLT 206 01/13/2022  ? ?No results found for: LABCA2 ? ?No components found for: [id number] ? ?No results for input(s): INR in the last 168 hours. ? ?No results found for: LABCA2 ? ?No results found for: IOE703 ? ?No results found for: JKK938 ? ?No results found for: HWE993 ? ?No results found for: CA2729 ? ?No components found for: HGQUANT ? ?No results found for: CEA1  / No results found for: CEA1  ? ?No results found for: AFPTUMOR ? ?No results found for: [place] ? ?No results found for: TOTALPROTELP, ALBUMINELP, A1GS, A2GS, BETS, BETA2SER, GAMS, MSPIKE, SPEI ?(thi

## 2022-02-17 ENCOUNTER — Ambulatory Visit
Admission: RE | Admit: 2022-02-17 | Discharge: 2022-02-17 | Disposition: A | Discharge status: Home / Self Care | Payer: BC Managed Care – PPO | Source: Ambulatory Visit | Attending: Obstetrics & Gynecology | Admitting: Obstetrics & Gynecology

## 2022-02-17 DIAGNOSIS — Z780 Asymptomatic menopausal state: Secondary | ICD-10-CM | POA: Diagnosis not present

## 2022-02-17 DIAGNOSIS — M85852 Other specified disorders of bone density and structure, left thigh: Secondary | ICD-10-CM | POA: Diagnosis not present

## 2022-02-17 DIAGNOSIS — M8580 Other specified disorders of bone density and structure, unspecified site: Secondary | ICD-10-CM

## 2022-07-26 DIAGNOSIS — R051 Acute cough: Secondary | ICD-10-CM | POA: Diagnosis not present

## 2022-07-26 DIAGNOSIS — J0110 Acute frontal sinusitis, unspecified: Secondary | ICD-10-CM | POA: Diagnosis not present

## 2022-07-26 DIAGNOSIS — Z23 Encounter for immunization: Secondary | ICD-10-CM | POA: Diagnosis not present

## 2022-07-26 DIAGNOSIS — Z6827 Body mass index (BMI) 27.0-27.9, adult: Secondary | ICD-10-CM | POA: Diagnosis not present

## 2022-10-14 ENCOUNTER — Ambulatory Visit (INDEPENDENT_AMBULATORY_CARE_PROVIDER_SITE_OTHER): Payer: BC Managed Care – PPO | Admitting: Obstetrics & Gynecology

## 2022-10-14 ENCOUNTER — Encounter (HOSPITAL_BASED_OUTPATIENT_CLINIC_OR_DEPARTMENT_OTHER): Payer: Self-pay | Admitting: Obstetrics & Gynecology

## 2022-10-14 ENCOUNTER — Ambulatory Visit (HOSPITAL_BASED_OUTPATIENT_CLINIC_OR_DEPARTMENT_OTHER): Payer: BC Managed Care – PPO | Admitting: Obstetrics & Gynecology

## 2022-10-14 VITALS — BP 131/82 | HR 81 | Ht 61.5 in | Wt 146.4 lb

## 2022-10-14 DIAGNOSIS — Z8249 Family history of ischemic heart disease and other diseases of the circulatory system: Secondary | ICD-10-CM

## 2022-10-14 DIAGNOSIS — R739 Hyperglycemia, unspecified: Secondary | ICD-10-CM | POA: Diagnosis not present

## 2022-10-14 DIAGNOSIS — C50211 Malignant neoplasm of upper-inner quadrant of right female breast: Secondary | ICD-10-CM

## 2022-10-14 DIAGNOSIS — Z01419 Encounter for gynecological examination (general) (routine) without abnormal findings: Secondary | ICD-10-CM | POA: Diagnosis not present

## 2022-10-14 DIAGNOSIS — L719 Rosacea, unspecified: Secondary | ICD-10-CM

## 2022-10-14 DIAGNOSIS — Z170 Estrogen receptor positive status [ER+]: Secondary | ICD-10-CM

## 2022-10-14 DIAGNOSIS — M8580 Other specified disorders of bone density and structure, unspecified site: Secondary | ICD-10-CM

## 2022-10-14 LAB — HEMOGLOBIN A1C
Est. average glucose Bld gHb Est-mCnc: 140 mg/dL
Hgb A1c MFr Bld: 6.5 % — ABNORMAL HIGH (ref 4.8–5.6)

## 2022-10-14 MED ORDER — MINOCYCLINE HCL 50 MG PO CAPS: ORAL_CAPSULE | ORAL | 4 refills | Status: DC

## 2022-10-14 NOTE — Progress Notes (Signed)
66 y.o. G2P0 Married White or Caucasian female here for annual exam.  Doing well.  Denies vaginal.  H/o breast cancer.  Followed by Dr. Chryl Heck who is taking over her care since Dr. Jana Hakim retired.  Hasn't seen PCP in several years.  Last lipid was in 2020.  Fasting today.    Needs minocycline refill.  Dr. Doris Cheadle would refill this for pt. S he uses only for rosacea flares.  No LMP recorded. Patient is postmenopausal.          Sexually active: Yes.    The current method of family planning is post menopausal status.    Exercising: No.   Smoker:  no  Health Maintenance: Pap:  09/08/21 neg History of abnormal Pap:  no MMG:  12/20/21 Colonoscopy:  09/2018, follow up 5 years BMD:   02/17/22, -1.4 Screening Labs: ordered today and CMP/CBC done with Dr. Chryl Heck   reports that she has never smoked. She has never used smokeless tobacco. She reports that she does not drink alcohol and does not use drugs.  Past Medical History:  Diagnosis Date   Breast cancer [facility])    Family history of breast cancer    Genetic testing 12/25/2017   Breast/GYN panel (23 genes) @ [facility] - No pathogenic mutations detected   Personal history of radiation therapy    Vitamin D deficiency     Past Surgical History:  Procedure Laterality Date   BREAST EXCISIONAL BIOPSY Left 2019   BREAST LUMPECTOMY Right 2019   BREAST LUMPECTOMY WITH RADIOACTIVE SEED AND SENTINEL LYMPH NODE BIOPSY Bilateral 01/08/2018   Procedure: BILATERAL BREAST LUMPECTOMY WITH BILATERAL  RADIOACTIVE SEEDS AND RIGHT  SENTINEL LYMPH NODE BIOPSY;  Surgeon: Alphonsa Overall, MD;  Location: [facility];  Service: General;  Laterality: Bilateral;   BREAST SURGERY     bilateral breast bx    Current Outpatient Medications  Medication Sig Dispense Refill   Cholecalciferol (VITAMIN D3 PO) Take 1 tablet by mouth daily.     minocycline (MINOCIN) 50 MG capsule 1 capsule bid as needed for rosacea flare 90 capsule 4   Multiple Vitamin (MULTIVITAMIN WITH MINERALS) TABS  tablet Take 1 tablet by mouth daily. Centrum Women's 50+     tamoxifen (NOLVADEX) 20 MG tablet Take 1 tablet (20 mg total) by mouth daily. 90 tablet 3   No current facility-administered medications for this visit.    Family History  Problem Relation Age of Onset   Breast cancer Mother 24       currently 47   Prostate cancer Father 40       currently 15   Breast cancer Sister 55       currently 82   Colon cancer Paternal Uncle        dx 98s; deceased 41s   Bladder Cancer Maternal Grandmother        dx 47s; deceased 67s    ROS: Constitutional: negative Genitourinary:negative  Exam:   BP 131/82   Pulse 81   Ht 5' 1.5" (1.562 m)   Wt 146 lb 6.4 oz (66.4 kg)   BMI 27.21 kg/m   Height: 5' 1.5" (156.2 cm)  General appearance: alert, cooperative and appears stated age Head: Normocephalic, without obvious abnormality, atraumatic Neck: no adenopathy, supple, symmetrical, trachea midline and thyroid normal to inspection and palpation Lungs: clear to auscultation bilaterally Breasts: normal appearance, no masses or tenderness,  well healed scars present bilaterally and these are stable. Heart: regular rate and rhythm Abdomen: soft, non-tender; bowel sounds  normal; no masses,  no organomegaly Extremities: extremities normal, atraumatic, no cyanosis or edema Skin: Skin color, texture, turgor normal. No rashes or lesions Lymph nodes: Cervical, supraclavicular, and axillary nodes normal. No abnormal inguinal nodes palpated Neurologic: Grossly normal   Pelvic: External genitalia:  no lesions              Urethra:  normal appearing urethra with no masses, tenderness or lesions              Bartholins and Skenes: normal                 Vagina: normal appearing vagina with normal color and no discharge, no lesions              Cervix: no lesions              Pap taken: No. Bimanual Exam:  Uterus:  normal size, contour, position, consistency, mobility, non-tender              Adnexa:  normal adnexa and no mass, fullness, tenderness               Rectovaginal: Confirms               Anus:  normal sphincter tone, no lesions  Chaperone, Octaviano Batty, CMA, was present for exam.  Assessment/Plan: 1. Encounter for well woman exam with routine gynecological exam - Pap smear neg with neg HR HPV 2022 - Mammogram up to date - Colonoscopy 2019, follow up 5 years - Bone mineral density done earlier this - lab work ordered - vaccines reviewed/updated  2. Family history of cardiovascular disease - TSH - Lipid panel  3. Blood glucose elevated - Hemoglobin A1c  4. Malignant neoplasm of upper-inner quadrant of right breast in female, estrogen receptor positive ([place]) - followed by Dr. Chryl Heck  5. Osteopenia, unspecified location  6. Rosacea - minocycline (MINOCIN) 50 MG capsule; 1 capsule bid as needed for rosacea flare  Dispense: 90 capsule; Refill: 4

## 2022-10-15 LAB — TSH: TSH: 2.18 u[IU]/mL (ref 0.450–4.500)

## 2022-10-15 LAB — LIPID PANEL
Chol/HDL Ratio: 2.6 ratio (ref 0.0–4.4)
Cholesterol, Total: 157 mg/dL (ref 100–199)
HDL: 60 mg/dL (ref >39)
LDL Chol Calc (NIH): 65 mg/dL (ref 0–99)
Triglycerides: 194 mg/dL — ABNORMAL HIGH (ref 0–149)
VLDL Cholesterol Cal: 32 mg/dL (ref 5–40)

## 2022-11-07 ENCOUNTER — Other Ambulatory Visit: Payer: Self-pay | Admitting: Obstetrics & Gynecology

## 2022-11-07 DIAGNOSIS — Z1231 Encounter for screening mammogram for malignant neoplasm of breast: Secondary | ICD-10-CM

## 2022-12-07 DIAGNOSIS — H6691 Otitis media, unspecified, right ear: Secondary | ICD-10-CM | POA: Diagnosis not present

## 2022-12-07 DIAGNOSIS — R6889 Other general symptoms and signs: Secondary | ICD-10-CM | POA: Diagnosis not present

## 2022-12-07 DIAGNOSIS — Z1152 Encounter for screening for COVID-19: Secondary | ICD-10-CM | POA: Diagnosis not present

## 2022-12-28 ENCOUNTER — Ambulatory Visit
Admission: RE | Admit: 2022-12-28 | Discharge: 2022-12-28 | Disposition: A | Discharge status: Home / Self Care | Payer: BC Managed Care – PPO | Source: Ambulatory Visit | Attending: Obstetrics & Gynecology | Admitting: Obstetrics & Gynecology

## 2022-12-28 DIAGNOSIS — Z1231 Encounter for screening mammogram for malignant neoplasm of breast: Secondary | ICD-10-CM

## 2023-01-04 ENCOUNTER — Telehealth: Payer: Self-pay | Admitting: Hematology and Oncology

## 2023-01-04 NOTE — Telephone Encounter (Signed)
Reached out to reschedule patient per iruku being out of office. Patient aware of date and time change.

## 2023-01-16 ENCOUNTER — Ambulatory Visit: Payer: BC Managed Care – PPO | Admitting: Hematology and Oncology

## 2023-02-01 ENCOUNTER — Inpatient Hospital Stay: Payer: BC Managed Care – PPO | Attending: Hematology and Oncology | Admitting: Hematology and Oncology

## 2023-02-01 ENCOUNTER — Other Ambulatory Visit: Payer: Self-pay

## 2023-02-01 VITALS — BP 138/84 | HR 81 | Temp 97.7°F | Resp 16 | Ht 61.0 in | Wt 141.9 lb

## 2023-02-01 DIAGNOSIS — Z170 Estrogen receptor positive status [ER+]: Secondary | ICD-10-CM | POA: Insufficient documentation

## 2023-02-01 DIAGNOSIS — Z79810 Long term (current) use of selective estrogen receptor modulators (SERMs): Secondary | ICD-10-CM | POA: Diagnosis not present

## 2023-02-01 DIAGNOSIS — C50211 Malignant neoplasm of upper-inner quadrant of right female breast: Secondary | ICD-10-CM | POA: Insufficient documentation

## 2023-02-01 MED ORDER — TAMOXIFEN CITRATE 20 MG PO TABS: 20.0000 mg | ORAL_TABLET | Freq: Every day | ORAL | 3 refills | Status: DC

## 2023-02-01 NOTE — Progress Notes (Signed)
[name]  Telephone:(336) [phone] Fax:(336) [phone]     ID: Lisa Valenzuela DOB: 1955/12/15  MR#: [id number]  JYN#:[id number]  Patient Care Team: Patient, No Pcp Per as PCP - General (General Practice) Magrinat, Valentino Hue, MD (Inactive) as Consulting Physician (Oncology) Ovidio Kin, MD as Consulting Physician (General Surgery) Dorothy Puffer, MD as Consulting Physician (Radiation Oncology) Axel Filler, Larna Daughters, NP as Nurse Practitioner (Hematology and Oncology) Jerene Bears, MD as Consulting Physician (Gynecology) OTHER MD:  CHIEF COMPLAINT: Estrogen receptor positive lobular breast cancer  CURRENT TREATMENT: Tamoxifen  INTERVAL HISTORY:  Lisa Valenzuela returns today for follow-up of her estrogen receptor positive breast cancer. She has been taking tamoxifen as instructed, has some mild hot flashes, overall tolerating it very well.  Most recent mammogram in March 2023 without any evidence of malignancy.  She denies any changes in her breast today.  No vaginal bleeding, change in breathing or lower extremity swelling.  Rest of the pertinent 10 point ROS reviewed and negative.   COVID 19 VACCINATION STATUS: Pfizer x2, most recently 12/2019; infection 04/2020; no booster as of 01/05/2019   HISTORY OF CURRENT ILLNESS: From the original intake note:  Lisa Valenzuela had routine screening mammography on 11/14/2017 showing a possible abnormality in her bilateral breasts. She underwent a bilateral diagnostic mammography with tomography and bilateral breast ultrasonography at [facility] on 11/17/2017 showing: breast density category C.  The left breast mass was evaluated and proved to be a benign cyst.  On the right however there was an area of architectural distortion, which could not be well delineated.  It was negative on ultrasonography.  The right axilla also was benign on ultrasound.  Accordingly on 11/21/2017 the patient proceeded to biopsy of the right breast area in  question. 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 E-cadherin negative. The base of tumor is HER-2 negative and the proliferation marker Ki67 is 2%.   The patient's subsequent history is as detailed below.   PAST MEDICAL HISTORY: Past Medical History:  Diagnosis Date   Breast cancer [facility])    Family history of breast cancer    Genetic testing 12/25/2017   Breast/GYN panel (23 genes) @ [facility] - No pathogenic mutations detected   Personal history of radiation therapy    Vitamin D deficiency     PAST SURGICAL HISTORY: Past Surgical History:  Procedure Laterality Date   BREAST EXCISIONAL BIOPSY Left 2019   BREAST LUMPECTOMY Right 2019   BREAST LUMPECTOMY WITH RADIOACTIVE SEED AND SENTINEL LYMPH NODE BIOPSY Bilateral 01/08/2018   Procedure: BILATERAL BREAST LUMPECTOMY WITH BILATERAL  RADIOACTIVE SEEDS AND RIGHT  SENTINEL LYMPH NODE BIOPSY;  Surgeon: Ovidio Kin, MD;  Location[facility];  Service: General;  Laterality: Bilateral;   BREAST SURGERY     bilateral breast bx    FAMILY HISTORY Family History  Problem Relation Age of Onset   Breast cancer Mother 69       currently 20   Prostate cancer Father 31       currently 54   Breast cancer Sister 63       currently 60   Colon cancer Paternal Uncle        dx 82s; deceased 76s   Bladder Cancer Maternal Grandmother        dx 1s; deceased 35s  The patient's father, 29, and mother,  81 are still alive. Her father was diagnosed with prostate cancer at 21 years old. Her mother was diagnosed with breast cancer at 67 years old. The patient has two brothers and two sisters. One of her sisters was diagnoses with breast cancer at 67 years old.    GYNECOLOGIC HISTORY:  No LMP recorded. Patient is postmenopausal. Menarche: 67 years old Age at first live birth: 67  years old GX P 2 LMP 2009 Contraceptive yes, 1980-2000 HRT no  Hysterectomy? no SO? no   SOCIAL HISTORY:  Allen works as an Soil scientist. Her husband, Dannielle Huh is a Education officer, environmental at [employer].  They have one daughter, Marchelle Folks,  who lives in [place], [place], and is a middle school Garment/textile technologist. They also have one son, Sheria Lang, who lives in [place], [place], and is a caddie and Veterinary surgeon. The patient has two grandchildren.     ADVANCED DIRECTIVES: In the absence of any documentation to the contrary, the patient's spouse is their HCPOA.    HEALTH MAINTENANCE: Social History   Tobacco Use   Smoking status: Never   Smokeless tobacco: Never  Vaping Use   Vaping Use: Never used  Substance Use Topics   Alcohol use: No   Drug use: No     Colonoscopy: 09/2018, recall 2024/ [facility]  PAP: 12/2017, negative  Bone density: 07/05/2018 showing a T score of -1.3.   No Known Allergies  Current Outpatient Medications  Medication Sig Dispense Refill   Cholecalciferol (VITAMIN D3 PO) Take 1 tablet by mouth daily.     minocycline (MINOCIN) 50 MG capsule 1 capsule bid as needed for rosacea flare 90 capsule 4   Multiple Vitamin (MULTIVITAMIN WITH MINERALS) TABS tablet Take 1 tablet by mouth daily. Centrum Women's 50+     tamoxifen (NOLVADEX) 20 MG tablet Take 1 tablet (20 mg total) by mouth daily. 90 tablet 3   No current facility-administered medications for this visit.    OBJECTIVE:  white woman who appears younger than stated age  Vitals:   02/01/23 1549  BP: 138/84  Pulse: 81  Resp: 16  Temp: 97.7 F (36.5 C)  SpO2: 100%     Body mass index is 26.81 kg/m.   Wt Readings from Last 3 Encounters:  02/01/23 141 lb 14.4 oz (64.4 kg)  10/14/22 146 lb 6.4 oz (66.4 kg)  01/13/22 143 lb 1.6 oz (64.9 kg)      ECOG FS:0 - Asymptomatic  Physical Exam Constitutional:      Appearance: Normal appearance.  HENT:     Head: Normocephalic and atraumatic.  Cardiovascular:      Rate and Rhythm: Normal rate and regular rhythm.  Pulmonary:     Effort: Pulmonary effort is normal.     Breath sounds: Normal breath sounds.  Chest:     Comments: bilateral breasts examined.  No palpable masses or regional adenopathy. Abdominal:     General: Abdomen is flat. Bowel sounds are normal.     Palpations: Abdomen is soft.  Musculoskeletal:        General: No swelling or tenderness. Normal range of motion.     Cervical back: Normal range of motion and neck supple. No rigidity.  Lymphadenopathy:     Cervical: No cervical adenopathy.  Skin:    General: Skin is warm and dry.  Neurological:     General: No focal deficit present.     Mental Status: She is alert.  Psychiatric:        Mood and Affect: Mood  normal.    Right  Oh thank you ma'am LAB RESULTS:  CMP     Component Value Date/Time   NA 142 01/13/2022 0903   K 3.9 01/13/2022 0903   CL 106 01/13/2022 0903   CO2 29 01/13/2022 0903   GLUCOSE 110 (H) 01/13/2022 0903   BUN 16 01/13/2022 0903   CREATININE 0.91 01/13/2022 0903   CALCIUM 9.6 01/13/2022 0903   PROT 6.9 01/13/2022 0903   ALBUMIN 4.1 01/13/2022 0903   AST 18 01/13/2022 0903   ALT 14 01/13/2022 0903   ALKPHOS 53 01/13/2022 0903   BILITOT 0.4 01/13/2022 0903   GFRNONAA >60 01/13/2022 0903   GFRAA >60 01/02/2020 0920   GFRAA >60 07/23/2018 0953    Lab Results  Component Value Date   WBC 6.1 01/13/2022   NEUTROABS 3.7 01/13/2022   HGB 13.1 01/13/2022   HCT 38.7 01/13/2022   MCV 92.4 01/13/2022   PLT 206 01/13/2022   No results found for: "LABCA2"  No components found for: "[id number]"  No results for input(s): "INR" in the last 168 hours.  No results found for: "LABCA2"  No results found for: "WUJ811"  No results found for: "CAN125"  No results found for: "CAN153"  No results found for: "CA2729"  No components found for: "HGQUANT"  No results found for: "CEA1", "CEA" / No results found for: "CEA1", "CEA"   No results found  for: "AFPTUMOR"  No results found for: "CHROMOGRNA"  No results found for: "TOTALPROTELP", "ALBUMINELP", "A1GS", "A2GS", "BETS", "BETA2SER", "GAMS", "MSPIKE", "SPEI" (this displays SPEP labs)  No results found for: "KPAFRELGTCHN", "LAMBDASER", "KAPLAMBRATIO" (kappa/lambda light chains)  No results found for: "HGBA", "HGBA2QUANT", "HGBFQUANT", "HGBSQUAN" (Hemoglobinopathy evaluation)   No results found for: "LDH"  No results found for: "IRON", "TIBC", "IRONPCTSAT" (Iron and TIBC)  No results found for: "FERRITIN"  Urinalysis No results found for: "COLORURINE", "APPEARANCEUR", "LABSPEC", "PHURINE", "GLUCOSEU", "HGBUR", "BILIRUBINUR", "KETONESUR", "PROTEINUR", "UROBILINOGEN", "NITRITE", "LEUKOCYTESUR"   STUDIES: No results found.   ELIGIBLE FOR AVAILABLE RESEARCH PROTOCOL:no  ASSESSMENT: 67 y.o. [place], [place] woman status post right breast upper inner quadrant biopsy 11/21/2017 for a clinical TXN0 invasive lobular carcinoma, grade 2, estrogen and progesterone receptor positive, HER-2 not amplified, with an MIB-1 of 2%.  (a) right breast upper outer quadrant biopsy 12/18/2017 showed no malignancy  (b) left breast retroareolar biopsy 12/18/2017 showed an atypical papillary lesion  (c) left lumpectomy 01/08/2018 showed an intraductal papilloma with no malignancy  (1) right lumpectomy and sentinel lymph node sampling 01/08/2018 showed a pT1b pN0, stage IA invasive lobular carcinoma, grade 1, with negative margins  (a) 2 right axillary lymph nodes were removed  (2) adjuvant radiation 02/15/2018 - 03/15/2018  Site/dose: 1. Right Breast / 42.56 Gy in 16 fractions 2. Boost / 8 Gy in 4 fractions Total dose of 50.56 Gy.  (4) tamoxifen started 04/12/2018  (5) genetics testing through the breast GYN panel at [facility] found no pathogenic mutations   PLAN:  Ms. Trentham is tolerating adjuvant tamoxifen very well without any concerns.   There is no evidence of recurrence on physical  examination or on review of systems. Her most recent mammogram is without any evidence of malignancy. She will continue tamoxifen till June 2024.   She will return to clinic in 1 year or sooner for any questions or concerns.   After 5 years of adjuvant treatment, she can continue to follow-up with [place] as needed and follow-up with her gynecologist for breast exams.  Total time spent: 30  minutes she is a new patient to me, transitioning from Dr. Darnelle Catalan upon his retirement.  *Total Encounter Time as defined by the Centers for Medicare and Medicaid Services includes, in addition to the face-to-face time of a patient visit (documented in the note above) non-face-to-face time: obtaining and reviewing outside history, ordering and reviewing medications, tests or procedures, care coordination (communications with other health care professionals or caregivers) and documentation in the medical record.

## 2023-02-01 NOTE — Progress Notes (Signed)
[facility]  Telephone:(336) [phone] Fax:(336) [phone]     ID: EMARI HREHA DOB: May 05, 1956  MR#: [id number]  VHQ#:[id number]  Patient Care Team: Patient, No Pcp Per as PCP - General (General Practice) Magrinat, Valentino Hue, MD (Inactive) as Consulting Physician (Oncology) Ovidio Kin, MD as Consulting Physician (General Surgery) Dorothy Puffer, MD as Consulting Physician (Radiation Oncology) Axel Filler, Larna Daughters, NP as Nurse Practitioner (Hematology and Oncology) Jerene Bears, MD as Consulting Physician (Gynecology) OTHER MD:  CHIEF COMPLAINT: Estrogen receptor positive lobular breast cancer  CURRENT TREATMENT: Tamoxifen  INTERVAL HISTORY:  Lisa Valenzuela returns today for follow-up of her estrogen receptor positive breast cancer. She has been taking tamoxifen as instructed, has some mild hot flashes, overall tolerating it very well.  Most recent mammogram in March 2024 without any evidence of malignancy.  She denies any changes in her breast today.  No vaginal bleeding, change in breathing or lower extremity swelling.  She exercises regularly.  She denies any breast changes.  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  Rest of the pertinent 10 point ROS reviewed and negative.   COVID 19 VACCINATION STATUS: Pfizer x2, most recently 12/2019; infection 04/2020; no booster as of 01/05/2019   HISTORY OF CURRENT ILLNESS: From the original intake note:  Lisa Valenzuela had routine screening mammography on 11/14/2017 showing a possible abnormality in her bilateral breasts. She underwent a bilateral diagnostic mammography with tomography and bilateral breast ultrasonography at [facility] on 11/17/2017 showing: breast density category C.  The left breast mass was evaluated and proved to be a benign cyst.  On the right however there was an area of architectural distortion, which could not be well delineated.  It was negative on ultrasonography.  The right axilla also was benign on  ultrasound.  Accordingly on 11/21/2017 the patient proceeded to biopsy of the right breast area in question. 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 E-cadherin negative. The base of tumor is HER-2 negative and the proliferation marker Ki67 is 2%.   The patient's subsequent history is as detailed below.   PAST MEDICAL HISTORY: Past Medical History:  Diagnosis Date   Breast cancer [facility])    Family history of breast cancer    Genetic testing 12/25/2017   Breast/GYN panel (23 genes) @ [facility] - No pathogenic mutations detected   Personal history of radiation therapy    Vitamin D deficiency     PAST SURGICAL HISTORY: Past Surgical History:  Procedure Laterality Date   BREAST EXCISIONAL BIOPSY Left 2019   BREAST LUMPECTOMY Right 2019   BREAST LUMPECTOMY WITH RADIOACTIVE SEED AND SENTINEL LYMPH NODE BIOPSY Bilateral 01/08/2018   Procedure: BILATERAL BREAST LUMPECTOMY WITH BILATERAL  RADIOACTIVE SEEDS AND RIGHT  SENTINEL LYMPH NODE BIOPSY;  Surgeon: Ovidio Kin, MD;  Location: [facility] OR;  Service: General;  Laterality: Bilateral;   BREAST SURGERY     bilateral breast bx    FAMILY HISTORY Family History  Problem Relation Age of Onset   Breast cancer Mother 32       currently 27   Prostate cancer Father 33       currently 86   Breast cancer Sister 33       currently 11   Colon cancer Paternal Uncle        dx 79s; deceased 54s   Bladder Cancer Maternal Grandmother  dx 100s; deceased 30s  The patient's father, 33, and mother, 41 are still alive. Her father was diagnosed with prostate cancer at 61 years old. Her mother was diagnosed with breast cancer at 67 years old. The patient has two brothers and two sisters. One of her sisters was diagnoses with breast cancer at 67 years old.    GYNECOLOGIC  HISTORY:  No LMP recorded. Patient is postmenopausal. Menarche: 67 years old Age at first live birth: 67 years old GX P 2 LMP 2009 Contraceptive yes, 1980-2000 HRT no  Hysterectomy? no SO? no   SOCIAL HISTORY:  Alden works as an Soil scientist. Her husband, Dannielle Huh is a Education officer, environmental at [employer].  They have one daughter, Marchelle Folks,  who lives in [place], [place], and is a middle school Garment/textile technologist. They also have one son, Sheria Lang, who lives in [place], [place], and is a caddie and Veterinary surgeon. The patient has two grandchildren.     ADVANCED DIRECTIVES: In the absence of any documentation to the contrary, the patient's spouse is their HCPOA.    HEALTH MAINTENANCE: Social History   Tobacco Use   Smoking status: Never   Smokeless tobacco: Never  Vaping Use   Vaping Use: Never used  Substance Use Topics   Alcohol use: No   Drug use: No     Colonoscopy: 09/2018, recall 2024/ [facility]  PAP: 12/2017, negative  Bone density: 07/05/2018 showing a T score of -1.3.   No Known Allergies  Current Outpatient Medications  Medication Sig Dispense Refill   Cholecalciferol (VITAMIN D3 PO) Take 1 tablet by mouth daily.     minocycline (MINOCIN) 50 MG capsule 1 capsule bid as needed for rosacea flare 90 capsule 4   Multiple Vitamin (MULTIVITAMIN WITH MINERALS) TABS tablet Take 1 tablet by mouth daily. Centrum Women's 50+     tamoxifen (NOLVADEX) 20 MG tablet Take 1 tablet (20 mg total) by mouth daily. 90 tablet 3   No current facility-administered medications for this visit.    OBJECTIVE:  white woman who appears younger than stated age  Vitals:   02/01/23 1549  BP: 138/84  Pulse: 81  Resp: 16  Temp: 97.7 F (36.5 C)  SpO2: 100%     Body mass index is 26.81 kg/m.   Wt Readings from Last 3 Encounters:  02/01/23 141 lb 14.4 oz (64.4 kg)  10/14/22 146 lb 6.4 oz (66.4 kg)  01/13/22 143 lb 1.6 oz (64.9 kg)      ECOG FS:0 - Asymptomatic  Physical  Exam Constitutional:      Appearance: Normal appearance.  HENT:     Head: Normocephalic and atraumatic.  Cardiovascular:     Rate and Rhythm: Normal rate and regular rhythm.  Pulmonary:     Effort: Pulmonary effort is normal.     Breath sounds: Normal breath sounds.  Abdominal:     General: Abdomen is flat. Bowel sounds are normal.     Palpations: Abdomen is soft.  Musculoskeletal:        General: No swelling or tenderness. Normal range of motion.     Cervical back: Normal range of motion and neck supple. No rigidity.  Lymphadenopathy:     Cervical: No cervical adenopathy.  Skin:    General: Skin is warm and dry.  Neurological:     General: No focal deficit present.     Mental Status: She is alert.  Psychiatric:        Mood and Affect: Mood normal.    Right  Oh thank you ma'am LAB RESULTS:  CMP     Component Value Date/Time   NA 142 01/13/2022 0903   K 3.9 01/13/2022 0903   CL 106 01/13/2022 0903   CO2 29 01/13/2022 0903   GLUCOSE 110 (H) 01/13/2022 0903   BUN 16 01/13/2022 0903   CREATININE 0.91 01/13/2022 0903   CALCIUM 9.6 01/13/2022 0903   PROT 6.9 01/13/2022 0903   ALBUMIN 4.1 01/13/2022 0903   AST 18 01/13/2022 0903   ALT 14 01/13/2022 0903   ALKPHOS 53 01/13/2022 0903   BILITOT 0.4 01/13/2022 0903   GFRNONAA >60 01/13/2022 0903   GFRAA >60 01/02/2020 0920   GFRAA >60 07/23/2018 0953    Lab Results  Component Value Date   WBC 6.1 01/13/2022   NEUTROABS 3.7 01/13/2022   HGB 13.1 01/13/2022   HCT 38.7 01/13/2022   MCV 92.4 01/13/2022   PLT 206 01/13/2022   No results found for: "LABCA2"  No components found for: "HWEXHB716"  No results for input(s): "INR" in the last 168 hours.  No results found for: "LABCA2"  No results found for: "RCV893"  No results found for: "CAN125"  No results found for: "CAN153"  No results found for: "CA2729"  No components found for: "HGQUANT"  No results found for: "CEA1", "CEA" / No results found for:  "CEA1", "CEA"   No results found for: "AFPTUMOR"  No results found for: "CHROMOGRNA"  No results found for: "TOTALPROTELP", "ALBUMINELP", "A1GS", "A2GS", "BETS", "BETA2SER", "GAMS", "MSPIKE", "SPEI" (this displays SPEP labs)  No results found for: "KPAFRELGTCHN", "LAMBDASER", "KAPLAMBRATIO" (kappa/lambda light chains)  No results found for: "HGBA", "HGBA2QUANT", "HGBFQUANT", "HGBSQUAN" (Hemoglobinopathy evaluation)   No results found for: "LDH"  No results found for: "IRON", "TIBC", "IRONPCTSAT" (Iron and TIBC)  No results found for: "FERRITIN"  Urinalysis No results found for: "COLORURINE", "APPEARANCEUR", "LABSPEC", "PHURINE", "GLUCOSEU", "HGBUR", "BILIRUBINUR", "KETONESUR", "PROTEINUR", "UROBILINOGEN", "NITRITE", "LEUKOCYTESUR"   STUDIES: No results found.   ELIGIBLE FOR AVAILABLE RESEARCH PROTOCOL:no  ASSESSMENT: 67 y.o. [place], [place] woman status post right breast upper inner quadrant biopsy 11/21/2017 for a clinical TXN0 invasive lobular carcinoma, grade 2, estrogen and progesterone receptor positive, HER-2 not amplified, with an MIB-1 of 2%.  (a) right breast upper outer quadrant biopsy 12/18/2017 showed no malignancy  (b) left breast retroareolar biopsy 12/18/2017 showed an atypical papillary lesion  (c) left lumpectomy 01/08/2018 showed an intraductal papilloma with no malignancy  (1) right lumpectomy and sentinel lymph node sampling 01/08/2018 showed a pT1b pN0, stage IA invasive lobular carcinoma, grade 1, with negative margins  (a) 2 right axillary lymph nodes were removed  (2) adjuvant radiation 02/15/2018 - 03/15/2018  Site/dose: 1. Right Breast / 42.56 Gy in 16 fractions 2. Boost / 8 Gy in 4 fractions Total dose of 50.56 Gy.  (4) tamoxifen started 04/12/2018  (5) genetics testing through the breast GYN panel at [facility] found no pathogenic mutations   PLAN:  Ms. Sprauer is tolerating adjuvant tamoxifen very well without any concerns.   There is no  evidence of recurrence on physical examination or on review of systems. Her most recent mammogram is without any evidence of malignancy. She had excellent question about duration of antiestrogen therapy since she will be completing 5 years in June 2024.  Given lobular phenotype, we have discussed about several options including considering breast cancer index versus completing 5 years versus considering to take 7 years of antiestrogen therapy.  She is leaning towards taking it for 7 years since she has  been tolerating it extremely well.  I think this is a reasonable approach given the lobular phenotype.  She is worried that her insurance may not necessarily pay for the breast cancer index test.  In this case have refilled the tamoxifen and she will return to clinic in 1 year.  Will continue annual mammograms and this is due in March 2025.  Total time spent: 30 minutes  *Total Encounter Time as defined by the Centers for Medicare and Medicaid Services includes, in addition to the face-to-face time of a patient visit (documented in the note above) non-face-to-face time: obtaining and reviewing outside history, ordering and reviewing medications, tests or procedures, care coordination (communications with other health care professionals or caregivers) and documentation in the medical record.

## 2023-10-23 ENCOUNTER — Other Ambulatory Visit (HOSPITAL_BASED_OUTPATIENT_CLINIC_OR_DEPARTMENT_OTHER): Payer: Self-pay

## 2023-10-23 ENCOUNTER — Ambulatory Visit (HOSPITAL_BASED_OUTPATIENT_CLINIC_OR_DEPARTMENT_OTHER): Payer: Medicare Other | Admitting: Obstetrics & Gynecology

## 2023-10-23 ENCOUNTER — Encounter (HOSPITAL_BASED_OUTPATIENT_CLINIC_OR_DEPARTMENT_OTHER): Payer: Self-pay | Admitting: Obstetrics & Gynecology

## 2023-10-23 VITALS — BP 132/82 | HR 77 | Ht 61.25 in | Wt 145.6 lb

## 2023-10-23 DIAGNOSIS — Z803 Family history of malignant neoplasm of breast: Secondary | ICD-10-CM

## 2023-10-23 DIAGNOSIS — Z9189 Other specified personal risk factors, not elsewhere classified: Secondary | ICD-10-CM

## 2023-10-23 DIAGNOSIS — M8580 Other specified disorders of bone density and structure, unspecified site: Secondary | ICD-10-CM | POA: Diagnosis not present

## 2023-10-23 DIAGNOSIS — E119 Type 2 diabetes mellitus without complications: Secondary | ICD-10-CM

## 2023-10-23 DIAGNOSIS — L719 Rosacea, unspecified: Secondary | ICD-10-CM

## 2023-10-23 DIAGNOSIS — L989 Disorder of the skin and subcutaneous tissue, unspecified: Secondary | ICD-10-CM

## 2023-10-23 MED ORDER — MINOCYCLINE HCL 50 MG PO CAPS: 50.0000 mg | ORAL_CAPSULE | Freq: Every day | ORAL | 4 refills | Status: AC | PRN

## 2023-10-23 MED ORDER — MINOCYCLINE HCL 50 MG PO CAPS: 50.0000 mg | ORAL_CAPSULE | Freq: Every day | ORAL | 4 refills | Status: DC | PRN

## 2023-10-23 MED ORDER — FLUAD 0.5 ML IM SUSY
0.5000 mL | PREFILLED_SYRINGE | Freq: Once | INTRAMUSCULAR | 0 refills | Status: AC
  Filled 2023-10-23: qty 0.5, 1d supply, fill #0

## 2023-10-23 NOTE — Patient Instructions (Addendum)
 Dr. Langston Reusing [address], [place], [place] [address] Phone: [phone]

## 2023-10-23 NOTE — Progress Notes (Addendum)
 68 y.o. G2P0 Married White or Caucasian female here for breast and pelvic exam.  I am also following her for history of breast cancer.  Saw Dr. Loretha in April.  Is seeing her once yearly.  On Tamoxifen  x 7 years.    Denies vaginal bleeding.  Uses minocycline  for rosacea flares.  Does not have a PCP.  Was diagnosed with diabetes last year.  She did not go for follow up.  Will recheck blood work today.  Has skin lesion she's like me to look at today.  No LMP recorded. Patient is postmenopausal.          Sexually active: Yes.    H/O STD:  no  Health Maintenance: PCP:  was Dr. Waylan Vaccines are up to date:  she is going to have her flu shot done today Colonoscopy:  2019, follow up 5 years.  She has already been contacted about follow up.  Has consult in February.  This is with Dr. Nola office. MMG:  12/28/2022 Negative BMD:  02/17/2022 Normal, osteopenia Last pap smear:  09/08/2021 Negative.   H/o abnormal pap smear:      reports that she has never smoked. She has never used smokeless tobacco. She reports that she does not drink alcohol and does not use drugs.  Past Medical History:  Diagnosis Date   Breast cancer [facility])    Family history of breast cancer    Genetic testing 12/25/2017   Breast/GYN panel (23 genes) @ [facility] - No pathogenic mutations detected   Personal history of radiation therapy    Vitamin D deficiency     Past Surgical History:  Procedure Laterality Date   BREAST EXCISIONAL BIOPSY Left 2019   BREAST LUMPECTOMY Right 2019   BREAST LUMPECTOMY WITH RADIOACTIVE SEED AND SENTINEL LYMPH NODE BIOPSY Bilateral 01/08/2018   Procedure: BILATERAL BREAST LUMPECTOMY WITH BILATERAL  RADIOACTIVE SEEDS AND RIGHT  SENTINEL LYMPH NODE BIOPSY;  Surgeon: Ethyl Lenis, MD;  Location: [facility] OR;  Service: General;  Laterality: Bilateral;   BREAST SURGERY     bilateral breast bx    Current Outpatient Medications  Medication Sig Dispense Refill   Cholecalciferol (VITAMIN D3 PO)  Take 1 tablet by mouth daily.     minocycline  (MINOCIN ) 50 MG capsule 1 capsule bid as needed for rosacea flare 90 capsule 4   Multiple Vitamin (MULTIVITAMIN WITH MINERALS) TABS tablet Take 1 tablet by mouth daily. Centrum Women's 50+     tamoxifen  (NOLVADEX ) 20 MG tablet Take 1 tablet (20 mg total) by mouth daily. 90 tablet 3   No current facility-administered medications for this visit.    Family History  Problem Relation Age of Onset   Breast cancer Mother 72       currently 1   Prostate cancer Father 57       currently 26   Breast cancer Sister 74       currently 58   Colon cancer Paternal Uncle        dx 40s; deceased 32s   Bladder Cancer Maternal Grandmother        dx 110s; deceased 51s    Review of Systems  Constitutional: Negative.   Genitourinary: Negative.     Exam:   BP 132/82   Pulse 77   Ht 5' 1.25 (1.556 m)   Wt 145 lb 9.6 oz (66 kg)   BMI 27.29 kg/m   Height: 5' 1.25 (155.6 cm)  General appearance: alert, cooperative and appears stated age Breasts: no  masses, skin changes, LAD, nipple discharge.  Well healed right breast scar. Abdomen: soft, non-tender; bowel sounds normal; no masses,  no organomegaly Lymph nodes: Cervical, supraclavicular, and axillary nodes normal.  No abnormal inguinal nodes palpated Neurologic: Grossly normal  Pelvic: External genitalia:  no lesions              Urethra:  normal appearing urethra with no masses, tenderness or lesions              Bartholins and Skenes: normal                 Vagina: normal appearing vagina with atrophic changes and no discharge, no lesions              Cervix: no lesions              Pap taken: No. Bimanual Exam:  Uterus:  normal size, contour, position, consistency, mobility, non-tender              Adnexa: normal adnexa and no mass, fullness, tenderness               Rectovaginal: Confirms               Anus:  normal sphincter tone, no lesions      Chaperone, Bascom Kotyk, CMA, was  present for exam.  Assessment/Plan: 1. GYN exam for high-risk Medicare patient (Primary) - Pap smear 2022 - Mammogram 12/2022 - Colonoscopy due. She is aware. - Bone mineral density reviewed - lab work done ordered - vaccines reviewed/updated  2. Osteopenia, unspecified location - will repeat 1-2 years  3. Family history of breast cancer  4. Type 2 diabetes mellitus without complication, without long-term current use of insulin (HCC) - Hemoglobin A1c - Comprehensive metabolic panel - Lipid panel  5. Rosacea - minocycline  (MINOCIN ) 50 MG capsule; Take 1 capsule (50 mg total) by mouth daily as needed (for rosacea flare). 1 capsule bid as needed for rosacea flare  Dispense: 90 capsule; Refill: 4   6. Skin lesion of left leg - image in chart - Ambulatory referral to Dermatology

## 2023-10-24 LAB — LIPID PANEL
Chol/HDL Ratio: 2.6 {ratio} (ref 0.0–4.4)
Cholesterol, Total: 203 mg/dL — ABNORMAL HIGH (ref 100–199)
HDL: 78 mg/dL (ref >39)
LDL Chol Calc (NIH): 107 mg/dL — ABNORMAL HIGH (ref 0–99)
Triglycerides: 104 mg/dL (ref 0–149)
VLDL Cholesterol Cal: 18 mg/dL (ref 5–40)

## 2023-10-24 LAB — COMPREHENSIVE METABOLIC PANEL
ALT: 18 [IU]/L (ref 0–32)
AST: 26 [IU]/L (ref 0–40)
Albumin: 4.6 g/dL (ref 3.9–4.9)
Alkaline Phosphatase: 72 [IU]/L (ref 44–121)
BUN/Creatinine Ratio: 15 (ref 12–28)
BUN: 15 mg/dL (ref 8–27)
Bilirubin Total: 0.3 mg/dL (ref 0.0–1.2)
CO2: 21 mmol/L (ref 20–29)
Calcium: 9.7 mg/dL (ref 8.7–10.3)
Chloride: 104 mmol/L (ref 96–106)
Creatinine, Ser: 0.99 mg/dL (ref 0.57–1.00)
Globulin, Total: 2.5 g/dL (ref 1.5–4.5)
Glucose: 111 mg/dL — ABNORMAL HIGH (ref 70–99)
Potassium: 4.1 mmol/L (ref 3.5–5.2)
Sodium: 143 mmol/L (ref 134–144)
Total Protein: 7.1 g/dL (ref 6.0–8.5)
eGFR: 62 mL/min/{1.73_m2} (ref >59)

## 2023-10-24 LAB — HEMOGLOBIN A1C
Est. average glucose Bld gHb Est-mCnc: 131 mg/dL
Hgb A1c MFr Bld: 6.2 % — ABNORMAL HIGH (ref 4.8–5.6)

## 2023-11-17 ENCOUNTER — Other Ambulatory Visit: Payer: Self-pay | Admitting: Obstetrics & Gynecology

## 2023-11-17 DIAGNOSIS — Z1231 Encounter for screening mammogram for malignant neoplasm of breast: Secondary | ICD-10-CM

## 2023-12-29 ENCOUNTER — Ambulatory Visit
Admission: RE | Admit: 2023-12-29 | Discharge: 2023-12-29 | Disposition: A | Discharge status: Home / Self Care | Payer: Medicare Other | Source: Ambulatory Visit | Attending: Obstetrics & Gynecology | Admitting: Obstetrics & Gynecology

## 2023-12-29 DIAGNOSIS — Z1231 Encounter for screening mammogram for malignant neoplasm of breast: Secondary | ICD-10-CM

## 2024-02-01 ENCOUNTER — Inpatient Hospital Stay: Payer: BC Managed Care – PPO | Attending: Hematology and Oncology | Admitting: Hematology and Oncology

## 2024-02-01 VITALS — BP 134/72 | HR 86 | Temp 98.3°F | Resp 16 | Wt 148.8 lb

## 2024-02-01 DIAGNOSIS — Z170 Estrogen receptor positive status [ER+]: Secondary | ICD-10-CM | POA: Insufficient documentation

## 2024-02-01 DIAGNOSIS — Z79810 Long term (current) use of selective estrogen receptor modulators (SERMs): Secondary | ICD-10-CM | POA: Diagnosis not present

## 2024-02-01 DIAGNOSIS — C50211 Malignant neoplasm of upper-inner quadrant of right female breast: Secondary | ICD-10-CM | POA: Diagnosis present

## 2024-02-01 NOTE — Progress Notes (Signed)
 [facility]  Telephone:([phone]) [phone] Fax:([phone]) [phone]     ID: SANVI EHLER DOB: Aug 11, 1956  MR#: [id number]  JYN#:[id number]  Patient Care Team: Patient, No Pcp Per as PCP - General (General Practice) Magrinat, Valentino Hue, MD (Inactive) as Consulting Physician (Oncology) Ovidio Kin, MD as Consulting Physician (General Surgery) Dorothy Puffer, MD as Consulting Physician (Radiation Oncology) Axel Filler, Larna Daughters, NP as Nurse Practitioner (Hematology and Oncology) Jerene Bears, MD as Consulting Physician (Gynecology) OTHER MD:  CHIEF COMPLAINT: Estrogen receptor positive lobular breast cancer  CURRENT TREATMENT: Tamoxifen  INTERVAL HISTORY:  Discussed the use of AI scribe software for clinical note transcription with the patient, who gave verbal consent to proceed.  History of Present Illness Lisa Valenzuela is a 68 year old female who presents for a follow-up visit regarding her ongoing treatment with tamoxifen.  She experiences mild hot flashes, which have remained consistent since the last visit. No other new health issues are reported. No bleeding, shortness of breath, or chest pain. Her bowel movements are regular.  Her current medications include tamoxifen, vitamin D, a multivitamin, and minocycline as needed. She is not using minocycline much anymore, which she previously used for rosacea.  A recent mammogram was normal, and she has a colonoscopy scheduled for next week. During a recent Medicare checkup, an EKG was performed, which showed common abnormalities. She declined an optional test to check for plaque.     COVID 19 VACCINATION STATUS: Pfizer x2, most recently 12/2019; infection 04/2020; no booster as of 01/05/2019   HISTORY OF CURRENT ILLNESS: From the original intake note:  Lisa Valenzuela had routine screening mammography on 11/14/2017 showing a possible abnormality in her bilateral breasts. She underwent a bilateral diagnostic mammography with  tomography and bilateral breast ultrasonography at [facility] on 11/17/2017 showing: breast density category C.  The left breast mass was evaluated and proved to be a benign cyst.  On the right however there was an area of architectural distortion, which could not be well delineated.  It was negative on ultrasonography.  The right axilla also was benign on ultrasound.  Accordingly on 11/21/2017 the patient proceeded to biopsy of the right breast area in question. 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 E-cadherin negative. The base of tumor is HER-2 negative and the proliferation marker Ki67 is 2%.   The patient's subsequent history is as detailed below.   PAST MEDICAL HISTORY: Past Medical History:  Diagnosis Date   Breast cancer [facility])    Family history of breast cancer    Genetic testing 12/25/2017   Breast/GYN panel (23 genes) @ [facility] - No pathogenic mutations detected   Personal history of radiation therapy    Vitamin D deficiency     PAST SURGICAL HISTORY: Past Surgical History:  Procedure Laterality Date   BREAST EXCISIONAL BIOPSY Left 2019   BREAST LUMPECTOMY Right 2019   BREAST LUMPECTOMY WITH RADIOACTIVE SEED AND SENTINEL LYMPH NODE BIOPSY Bilateral 01/08/2018   Procedure: BILATERAL BREAST LUMPECTOMY WITH BILATERAL  RADIOACTIVE SEEDS AND RIGHT  SENTINEL LYMPH NODE BIOPSY;  Surgeon: Ovidio Kin, MD;  Location: [facility] OR;  Service: General;  Laterality: Bilateral;   BREAST SURGERY     bilateral breast bx    FAMILY HISTORY Family History  Problem Relation Age of Onset   Colon cancer Mother 8   Breast cancer Mother  62       currently 82   Prostate cancer Father 19       currently 2   Breast cancer Sister 59       currently 30   Bladder Cancer Maternal Grandmother        dx 44s;  deceased 17s   Colon cancer Paternal Uncle        dx 40s; deceased 75s  The patient's father, 79, and mother, 18 are still alive. Her father was diagnosed with prostate cancer at 67 years old. Her mother was diagnosed with breast cancer at 68 years old. The patient has two brothers and two sisters. One of her sisters was diagnoses with breast cancer at 68 years old.    GYNECOLOGIC HISTORY:  No LMP recorded. Patient is postmenopausal. Menarche: 68 years old Age at first live birth: 68 years old GX P 2 LMP 2009 Contraceptive yes, 1980-2000 HRT no  Hysterectomy? no SO? no   SOCIAL HISTORY:  Jubilee works as an Soil scientist. Her husband, Dannielle Huh is a Education officer, environmental at [employer].  They have one daughter, Marchelle Folks,  who lives in [place], [place], and is a middle school Garment/textile technologist. They also have one son, Sheria Lang, who lives in [place], [place], and is a caddie and Veterinary surgeon. The patient has two grandchildren.     ADVANCED DIRECTIVES: In the absence of any documentation to the contrary, the patient's spouse is their HCPOA.    HEALTH MAINTENANCE: Social History   Tobacco Use   Smoking status: Never   Smokeless tobacco: Never  Vaping Use   Vaping status: Never Used  Substance Use Topics   Alcohol use: No   Drug use: No     Colonoscopy: 09/2018, recall 2024/ [facility]  PAP: 12/2017, negative  Bone density: 07/05/2018 showing a T score of -1.3.   Allergies  Allergen Reactions   Amoxicillin Rash    Current Outpatient Medications  Medication Sig Dispense Refill   Cholecalciferol (VITAMIN D3 PO) Take 1 tablet by mouth daily.     minocycline (MINOCIN) 50 MG capsule Take 1 capsule (50 mg total) by mouth daily as needed (for rosacea flare). 1 capsule bid as needed for rosacea flare 90 capsule 4   Multiple Vitamin (MULTIVITAMIN WITH MINERALS) TABS tablet Take 1 tablet by mouth daily. Centrum Women's 50+     tamoxifen (NOLVADEX) 20 MG tablet Take 1 tablet (20 mg total) by  mouth daily. 90 tablet 3   No current facility-administered medications for this visit.    OBJECTIVE:  white woman who appears younger than stated age  Vitals:   02/01/24 1544  BP: 134/72  Pulse: 86  Resp: 16  Temp: 98.3 F (36.8 C)  SpO2: 98%     Body mass index is 27.89 kg/m.   Wt Readings from Last 3 Encounters:  02/01/24 148 lb 12.8 oz (67.5 kg)  10/23/23 145 lb 9.6 oz (66 kg)  02/01/23 141 lb 14.4 oz (64.4 kg)      ECOG FS:0 - Asymptomatic  Physical Exam Constitutional:      Appearance: Normal appearance.  HENT:     Head: Normocephalic and atraumatic.  Cardiovascular:     Rate and Rhythm: Normal rate and regular rhythm.  Pulmonary:     Effort: Pulmonary effort is normal.     Breath sounds: Normal breath sounds.  Abdominal:     General: Abdomen is flat. Bowel sounds are normal.     Palpations: Abdomen is soft.  Musculoskeletal:  General: No swelling or tenderness. Normal range of motion.     Cervical back: Normal range of motion and neck supple. No rigidity.  Lymphadenopathy:     Cervical: No cervical adenopathy.  Skin:    General: Skin is warm and dry.  Neurological:     General: No focal deficit present.     Mental Status: She is alert.  Psychiatric:        Mood and Affect: Mood normal.    LAB RESULTS:  CMP     Component Value Date/Time   NA 143 10/23/2023 1021   K 4.1 10/23/2023 1021   CL 104 10/23/2023 1021   CO2 21 10/23/2023 1021   GLUCOSE 111 (H) 10/23/2023 1021   GLUCOSE 110 (H) 01/13/2022 0903   BUN 15 10/23/2023 1021   CREATININE 0.99 10/23/2023 1021   CREATININE 0.91 01/13/2022 0903   CALCIUM 9.7 10/23/2023 1021   PROT 7.1 10/23/2023 1021   ALBUMIN 4.6 10/23/2023 1021   AST 26 10/23/2023 1021   AST 18 01/13/2022 0903   ALT 18 10/23/2023 1021   ALT 14 01/13/2022 0903   ALKPHOS 72 10/23/2023 1021   BILITOT 0.3 10/23/2023 1021   BILITOT 0.4 01/13/2022 0903   GFRNONAA >60 01/13/2022 0903   GFRAA >60 01/02/2020 0920   GFRAA  >60 07/23/2018 0953    Lab Results  Component Value Date   WBC 6.1 01/13/2022   NEUTROABS 3.7 01/13/2022   HGB 13.1 01/13/2022   HCT 38.7 01/13/2022   MCV 92.4 01/13/2022   PLT 206 01/13/2022   No results found for: "LABCA2"  No components found for: "WUJWJX914"  No results for input(s): "INR" in the last 168 hours.  No results found for: "LABCA2"  No results found for: "NWG956"  No results found for: "CAN125"  No results found for: "CAN153"  No results found for: "CA2729"  No components found for: "HGQUANT"  No results found for: "CEA1", "CEA" / No results found for: "CEA1", "CEA"   No results found for: "AFPTUMOR"  No results found for: "CHROMOGRNA"  No results found for: "TOTALPROTELP", "ALBUMINELP", "A1GS", "A2GS", "BETS", "BETA2SER", "GAMS", "MSPIKE", "SPEI" (this displays SPEP labs)  No results found for: "KPAFRELGTCHN", "LAMBDASER", "KAPLAMBRATIO" (kappa/lambda light chains)  No results found for: "HGBA", "HGBA2QUANT", "HGBFQUANT", "HGBSQUAN" (Hemoglobinopathy evaluation)   No results found for: "LDH"  No results found for: "IRON", "TIBC", "IRONPCTSAT" (Iron and TIBC)  No results found for: "FERRITIN"  Urinalysis No results found for: "COLORURINE", "APPEARANCEUR", "LABSPEC", "PHURINE", "GLUCOSEU", "HGBUR", "BILIRUBINUR", "KETONESUR", "PROTEINUR", "UROBILINOGEN", "NITRITE", "LEUKOCYTESUR"   STUDIES: No results found.   ELIGIBLE FOR AVAILABLE RESEARCH PROTOCOL:no  ASSESSMENT: 68 y[place] status post right breast upper inner quadrant biopsy 11/21/2017 for a clinical TXN0 invasive lobular carcinoma, grade 2, estrogen and progesterone receptor positive, HER-2 not amplified, with an MIB-1 of 2%.  (a) right breast upper outer quadrant biopsy 12/18/2017 showed no malignancy  (b) left breast retroareolar biopsy 12/18/2017 showed an atypical papillary lesion  (c) left lumpectomy 01/08/2018 showed an intraductal papilloma with no  malignancy  (1) right lumpectomy and sentinel lymph node sampling 01/08/2018 showed a pT1b pN0, stage IA invasive lobular carcinoma, grade 1, with negative margins  (a) 2 right axillary lymph nodes were removed  (2) adjuvant radiation 02/15/2018 - 03/15/2018  Site/dose: 1. Right Breast / 42.56 Gy in 16 fractions 2. Boost / 8 Gy in 4 fractions Total dose of 50.56 Gy.  (4) tamoxifen started 04/12/2018  (5) genetics testing through the breast  GYN panel at [facility] found no pathogenic mutations   PLAN:  Ms. Dinkel is tolerating adjuvant tamoxifen very well without any concerns.   There is no evidence of recurrence on physical examination or on review of systems. Her most recent mammogram is without any evidence of malignancy. She will continue tamoxifen for 7 yrs, will complete June 2026. She will continue SBE, educated her about the risk of DVT/PE,symptoms of DVT No vaginal bleeding reported, recommended to report any. RTC in 1 yr or sooner as needed.  Total time spent: 20 minutes  *Total Encounter Time as defined by the Centers for Medicare and Medicaid Services includes, in addition to the face-to-face time of a patient visit (documented in the note above) non-face-to-face time: obtaining and reviewing outside history, ordering and reviewing medications, tests or procedures, care coordination (communications with other health care professionals or caregivers) and documentation in the medical record.

## 2024-02-23 ENCOUNTER — Other Ambulatory Visit: Payer: Self-pay | Admitting: *Deleted

## 2024-02-23 MED ORDER — TAMOXIFEN CITRATE 20 MG PO TABS: 20.0000 mg | ORAL_TABLET | Freq: Every day | ORAL | 3 refills | Status: AC

## 2024-03-19 ENCOUNTER — Ambulatory Visit: Payer: Medicare Other | Admitting: Dermatology

## 2025-02-04 ENCOUNTER — Ambulatory Visit: Admitting: Hematology and Oncology
